# Patient Record
Sex: Female | Born: 1970 | Hispanic: No | Marital: Single | Smoking: Former smoker
Health system: Southern US, Community
[De-identification: ages and names within clinical notes are randomized; demographics above are authoritative.]

## PROBLEM LIST (undated history)

## (undated) DIAGNOSIS — F419 Anxiety disorder, unspecified: Secondary | ICD-10-CM

## (undated) DIAGNOSIS — F32A Depression, unspecified: Secondary | ICD-10-CM

## (undated) DIAGNOSIS — F4310 Post-traumatic stress disorder, unspecified: Secondary | ICD-10-CM

## (undated) DIAGNOSIS — F329 Major depressive disorder, single episode, unspecified: Secondary | ICD-10-CM

## (undated) HISTORY — DX: Post-traumatic stress disorder, unspecified: F43.10

## (undated) HISTORY — PX: CARPAL TUNNEL RELEASE: SHX101

## (undated) HISTORY — DX: Depression, unspecified: F32.A

## (undated) HISTORY — PX: TUBAL LIGATION: SHX77

## (undated) HISTORY — DX: Major depressive disorder, single episode, unspecified: F32.9

## (undated) HISTORY — DX: Anxiety disorder, unspecified: F41.9

---

## 1998-08-25 ENCOUNTER — Emergency Department (HOSPITAL_COMMUNITY): Admission: EM | Admit: 1998-08-25 | Discharge: 1998-08-25 | Payer: Self-pay | Admitting: Endocrinology

## 1998-09-24 ENCOUNTER — Encounter: Admission: RE | Admit: 1998-09-24 | Discharge: 1998-09-24 | Payer: Self-pay | Admitting: Sports Medicine

## 1999-02-18 ENCOUNTER — Encounter: Admission: RE | Admit: 1999-02-18 | Discharge: 1999-02-18 | Payer: Self-pay | Admitting: Family Medicine

## 1999-03-11 ENCOUNTER — Encounter: Admission: RE | Admit: 1999-03-11 | Discharge: 1999-03-11 | Payer: Self-pay | Admitting: Family Medicine

## 1999-03-11 ENCOUNTER — Other Ambulatory Visit: Admission: RE | Admit: 1999-03-11 | Discharge: 1999-03-11 | Payer: Self-pay | Admitting: *Deleted

## 1999-04-10 ENCOUNTER — Encounter: Admission: RE | Admit: 1999-04-10 | Discharge: 1999-04-10 | Payer: Self-pay | Admitting: Family Medicine

## 1999-05-10 ENCOUNTER — Ambulatory Visit (HOSPITAL_COMMUNITY): Admission: RE | Admit: 1999-05-10 | Discharge: 1999-05-10 | Payer: Self-pay | Admitting: *Deleted

## 1999-05-17 ENCOUNTER — Encounter: Admission: RE | Admit: 1999-05-17 | Discharge: 1999-05-17 | Payer: Self-pay | Admitting: Family Medicine

## 1999-05-27 ENCOUNTER — Encounter: Admission: RE | Admit: 1999-05-27 | Discharge: 1999-05-27 | Payer: Self-pay | Admitting: Family Medicine

## 1999-06-26 ENCOUNTER — Encounter: Admission: RE | Admit: 1999-06-26 | Discharge: 1999-06-26 | Payer: Self-pay | Admitting: Family Medicine

## 1999-06-28 ENCOUNTER — Encounter: Admission: RE | Admit: 1999-06-28 | Discharge: 1999-06-28 | Payer: Self-pay | Admitting: Sports Medicine

## 1999-07-11 ENCOUNTER — Ambulatory Visit (HOSPITAL_COMMUNITY): Admission: RE | Admit: 1999-07-11 | Discharge: 1999-07-11 | Payer: Self-pay | Admitting: *Deleted

## 1999-07-26 ENCOUNTER — Encounter: Admission: RE | Admit: 1999-07-26 | Discharge: 1999-07-26 | Payer: Self-pay | Admitting: Family Medicine

## 1999-08-09 ENCOUNTER — Encounter: Admission: RE | Admit: 1999-08-09 | Discharge: 1999-08-09 | Payer: Self-pay | Admitting: Family Medicine

## 1999-08-22 ENCOUNTER — Encounter: Admission: RE | Admit: 1999-08-22 | Discharge: 1999-08-22 | Payer: Self-pay | Admitting: Family Medicine

## 1999-09-05 ENCOUNTER — Encounter: Admission: RE | Admit: 1999-09-05 | Discharge: 1999-09-05 | Payer: Self-pay | Admitting: Family Medicine

## 1999-09-13 ENCOUNTER — Encounter: Admission: RE | Admit: 1999-09-13 | Discharge: 1999-09-13 | Payer: Self-pay | Admitting: Family Medicine

## 1999-09-18 ENCOUNTER — Encounter: Admission: RE | Admit: 1999-09-18 | Discharge: 1999-09-18 | Payer: Self-pay | Admitting: Family Medicine

## 1999-09-27 ENCOUNTER — Encounter: Admission: RE | Admit: 1999-09-27 | Discharge: 1999-09-27 | Payer: Self-pay | Admitting: Family Medicine

## 1999-10-04 ENCOUNTER — Encounter: Admission: RE | Admit: 1999-10-04 | Discharge: 1999-10-04 | Payer: Self-pay | Admitting: Family Medicine

## 1999-10-05 ENCOUNTER — Inpatient Hospital Stay (HOSPITAL_COMMUNITY): Admission: AD | Admit: 1999-10-05 | Discharge: 1999-10-07 | Payer: Self-pay | Admitting: Obstetrics

## 1999-12-27 ENCOUNTER — Encounter: Admission: RE | Admit: 1999-12-27 | Discharge: 1999-12-27 | Payer: Self-pay | Admitting: Family Medicine

## 2000-02-02 ENCOUNTER — Emergency Department (HOSPITAL_COMMUNITY): Admission: EM | Admit: 2000-02-02 | Discharge: 2000-02-02 | Payer: Self-pay | Admitting: Emergency Medicine

## 2000-02-02 ENCOUNTER — Encounter: Payer: Self-pay | Admitting: Emergency Medicine

## 2000-02-11 ENCOUNTER — Emergency Department (HOSPITAL_COMMUNITY): Admission: EM | Admit: 2000-02-11 | Discharge: 2000-02-11 | Payer: Self-pay | Admitting: Emergency Medicine

## 2000-02-11 ENCOUNTER — Encounter: Payer: Self-pay | Admitting: Emergency Medicine

## 2000-02-26 ENCOUNTER — Encounter: Admission: RE | Admit: 2000-02-26 | Discharge: 2000-05-26 | Payer: Self-pay | Admitting: Orthopedic Surgery

## 2001-02-06 ENCOUNTER — Emergency Department (HOSPITAL_COMMUNITY): Admission: EM | Admit: 2001-02-06 | Discharge: 2001-02-06 | Payer: Self-pay | Admitting: Emergency Medicine

## 2001-03-11 ENCOUNTER — Encounter: Admission: RE | Admit: 2001-03-11 | Discharge: 2001-03-11 | Payer: Self-pay | Admitting: Family Medicine

## 2001-03-24 ENCOUNTER — Encounter: Admission: RE | Admit: 2001-03-24 | Discharge: 2001-03-24 | Payer: Self-pay | Admitting: Family Medicine

## 2001-05-19 ENCOUNTER — Encounter: Payer: Self-pay | Admitting: Emergency Medicine

## 2001-05-19 ENCOUNTER — Emergency Department (HOSPITAL_COMMUNITY): Admission: EM | Admit: 2001-05-19 | Discharge: 2001-05-19 | Payer: Self-pay | Admitting: Emergency Medicine

## 2002-02-08 ENCOUNTER — Emergency Department (HOSPITAL_COMMUNITY): Admission: EM | Admit: 2002-02-08 | Discharge: 2002-02-08 | Payer: Self-pay | Admitting: Family Medicine

## 2003-07-18 ENCOUNTER — Emergency Department (HOSPITAL_COMMUNITY): Admission: EM | Admit: 2003-07-18 | Discharge: 2003-07-18 | Payer: Self-pay | Admitting: Emergency Medicine

## 2003-08-28 ENCOUNTER — Inpatient Hospital Stay (HOSPITAL_COMMUNITY): Admission: EM | Admit: 2003-08-28 | Discharge: 2003-08-31 | Payer: Self-pay | Admitting: Psychiatry

## 2003-11-18 ENCOUNTER — Emergency Department (HOSPITAL_COMMUNITY): Admission: EM | Admit: 2003-11-18 | Discharge: 2003-11-18 | Payer: Self-pay | Admitting: Emergency Medicine

## 2004-01-05 ENCOUNTER — Other Ambulatory Visit: Admission: RE | Admit: 2004-01-05 | Discharge: 2004-01-05 | Payer: Self-pay | Admitting: Family Medicine

## 2004-01-05 ENCOUNTER — Encounter: Admission: RE | Admit: 2004-01-05 | Discharge: 2004-01-05 | Payer: Self-pay | Admitting: Family Medicine

## 2004-01-12 ENCOUNTER — Encounter: Admission: RE | Admit: 2004-01-12 | Discharge: 2004-01-12 | Payer: Self-pay | Admitting: Sports Medicine

## 2004-01-14 ENCOUNTER — Encounter (INDEPENDENT_AMBULATORY_CARE_PROVIDER_SITE_OTHER): Payer: Self-pay | Admitting: *Deleted

## 2004-01-26 ENCOUNTER — Encounter: Admission: RE | Admit: 2004-01-26 | Discharge: 2004-01-26 | Payer: Self-pay | Admitting: Family Medicine

## 2004-12-15 ENCOUNTER — Emergency Department (HOSPITAL_COMMUNITY): Admission: EM | Admit: 2004-12-15 | Discharge: 2004-12-15 | Payer: Self-pay | Admitting: Emergency Medicine

## 2006-10-08 DIAGNOSIS — E781 Pure hyperglyceridemia: Secondary | ICD-10-CM

## 2006-10-08 DIAGNOSIS — G4700 Insomnia, unspecified: Secondary | ICD-10-CM

## 2006-10-08 DIAGNOSIS — F4323 Adjustment disorder with mixed anxiety and depressed mood: Secondary | ICD-10-CM | POA: Insufficient documentation

## 2006-10-09 ENCOUNTER — Encounter (INDEPENDENT_AMBULATORY_CARE_PROVIDER_SITE_OTHER): Payer: Self-pay | Admitting: *Deleted

## 2006-10-25 ENCOUNTER — Inpatient Hospital Stay (HOSPITAL_COMMUNITY): Admission: AD | Admit: 2006-10-25 | Discharge: 2006-10-25 | Payer: Self-pay | Admitting: Gynecology

## 2007-01-13 ENCOUNTER — Inpatient Hospital Stay (HOSPITAL_COMMUNITY): Admission: AD | Admit: 2007-01-13 | Discharge: 2007-01-13 | Payer: Self-pay | Admitting: Obstetrics and Gynecology

## 2007-02-28 ENCOUNTER — Inpatient Hospital Stay (HOSPITAL_COMMUNITY): Admission: AD | Admit: 2007-02-28 | Discharge: 2007-03-01 | Payer: Self-pay | Admitting: Obstetrics and Gynecology

## 2007-03-02 ENCOUNTER — Inpatient Hospital Stay (HOSPITAL_COMMUNITY): Admission: AD | Admit: 2007-03-02 | Discharge: 2007-03-02 | Payer: Self-pay | Admitting: Obstetrics and Gynecology

## 2007-03-08 ENCOUNTER — Inpatient Hospital Stay (HOSPITAL_COMMUNITY): Admission: AD | Admit: 2007-03-08 | Discharge: 2007-03-08 | Payer: Self-pay | Admitting: Obstetrics and Gynecology

## 2007-03-09 ENCOUNTER — Inpatient Hospital Stay (HOSPITAL_COMMUNITY): Admission: AD | Admit: 2007-03-09 | Discharge: 2007-03-12 | Payer: Self-pay | Admitting: Obstetrics and Gynecology

## 2007-03-19 ENCOUNTER — Emergency Department (HOSPITAL_COMMUNITY): Admission: EM | Admit: 2007-03-19 | Discharge: 2007-03-19 | Payer: Self-pay | Admitting: Emergency Medicine

## 2007-04-02 ENCOUNTER — Emergency Department (HOSPITAL_COMMUNITY): Admission: EM | Admit: 2007-04-02 | Discharge: 2007-04-02 | Payer: Self-pay | Admitting: Emergency Medicine

## 2007-04-19 ENCOUNTER — Encounter: Admission: RE | Admit: 2007-04-19 | Discharge: 2007-04-19 | Payer: Self-pay | Admitting: Cardiovascular Disease

## 2007-05-18 ENCOUNTER — Encounter: Admission: RE | Admit: 2007-05-18 | Discharge: 2007-05-18 | Payer: Self-pay | Admitting: Cardiovascular Disease

## 2007-06-05 ENCOUNTER — Emergency Department (HOSPITAL_COMMUNITY): Admission: EM | Admit: 2007-06-05 | Discharge: 2007-06-05 | Payer: Self-pay | Admitting: Emergency Medicine

## 2007-07-11 ENCOUNTER — Emergency Department (HOSPITAL_COMMUNITY): Admission: EM | Admit: 2007-07-11 | Discharge: 2007-07-12 | Payer: Self-pay | Admitting: Emergency Medicine

## 2007-08-12 HISTORY — PX: BACK SURGERY: SHX140

## 2007-09-09 ENCOUNTER — Inpatient Hospital Stay (HOSPITAL_COMMUNITY): Admission: EM | Admit: 2007-09-09 | Discharge: 2007-09-16 | Payer: Self-pay | Admitting: Emergency Medicine

## 2007-09-28 ENCOUNTER — Emergency Department (HOSPITAL_COMMUNITY): Admission: EM | Admit: 2007-09-28 | Discharge: 2007-09-29 | Payer: Self-pay | Admitting: Emergency Medicine

## 2007-10-08 ENCOUNTER — Ambulatory Visit: Payer: Self-pay | Admitting: Surgery

## 2007-10-08 ENCOUNTER — Ambulatory Visit (HOSPITAL_COMMUNITY): Admission: RE | Admit: 2007-10-08 | Discharge: 2007-10-08 | Payer: Self-pay | Admitting: Neurological Surgery

## 2007-10-08 ENCOUNTER — Encounter (INDEPENDENT_AMBULATORY_CARE_PROVIDER_SITE_OTHER): Payer: Self-pay | Admitting: Neurological Surgery

## 2007-10-12 ENCOUNTER — Telehealth: Payer: Self-pay | Admitting: *Deleted

## 2007-12-24 ENCOUNTER — Emergency Department (HOSPITAL_COMMUNITY): Admission: EM | Admit: 2007-12-24 | Discharge: 2007-12-24 | Payer: Self-pay | Admitting: Emergency Medicine

## 2008-02-06 ENCOUNTER — Emergency Department (HOSPITAL_COMMUNITY): Admission: EM | Admit: 2008-02-06 | Discharge: 2008-02-06 | Payer: Self-pay | Admitting: Emergency Medicine

## 2008-03-10 ENCOUNTER — Emergency Department (HOSPITAL_COMMUNITY): Admission: EM | Admit: 2008-03-10 | Discharge: 2008-03-10 | Payer: Self-pay | Admitting: Emergency Medicine

## 2008-03-13 ENCOUNTER — Emergency Department (HOSPITAL_COMMUNITY): Admission: EM | Admit: 2008-03-13 | Discharge: 2008-03-13 | Payer: Self-pay | Admitting: Emergency Medicine
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
## 2008-03-29 ENCOUNTER — Emergency Department (HOSPITAL_COMMUNITY): Admission: EM | Admit: 2008-03-29 | Discharge: 2008-03-29 | Payer: Self-pay | Admitting: Emergency Medicine

## 2008-06-03 ENCOUNTER — Encounter: Admission: RE | Admit: 2008-06-03 | Discharge: 2008-06-03 | Payer: Self-pay | Admitting: Cardiovascular Disease

## 2008-09-27 ENCOUNTER — Emergency Department: Admission: EM | Admit: 2008-09-27 | Discharge: 2008-09-27 | Payer: Self-pay | Admitting: Emergency Medicine

## 2009-02-03 ENCOUNTER — Emergency Department (HOSPITAL_COMMUNITY): Admission: EM | Admit: 2009-02-03 | Discharge: 2009-02-03 | Payer: Self-pay | Admitting: Emergency Medicine

## 2009-05-07 ENCOUNTER — Ambulatory Visit (HOSPITAL_COMMUNITY): Admission: RE | Admit: 2009-05-07 | Discharge: 2009-05-07 | Payer: Self-pay | Admitting: Obstetrics & Gynecology

## 2009-06-19 ENCOUNTER — Ambulatory Visit: Payer: Self-pay | Admitting: Vascular Surgery

## 2009-06-19 ENCOUNTER — Encounter (INDEPENDENT_AMBULATORY_CARE_PROVIDER_SITE_OTHER): Payer: Self-pay | Admitting: Cardiovascular Disease

## 2009-06-19 ENCOUNTER — Ambulatory Visit (HOSPITAL_COMMUNITY): Admission: RE | Admit: 2009-06-19 | Discharge: 2009-06-19 | Payer: Self-pay | Admitting: Cardiovascular Disease

## 2009-10-18 ENCOUNTER — Emergency Department (HOSPITAL_COMMUNITY): Admission: EM | Admit: 2009-10-18 | Discharge: 2009-10-18 | Payer: Self-pay | Admitting: Emergency Medicine

## 2009-10-22 ENCOUNTER — Ambulatory Visit (HOSPITAL_BASED_OUTPATIENT_CLINIC_OR_DEPARTMENT_OTHER): Admission: RE | Admit: 2009-10-22 | Discharge: 2009-10-22 | Payer: Self-pay | Admitting: Otolaryngology

## 2010-04-03 ENCOUNTER — Emergency Department (HOSPITAL_COMMUNITY): Admission: EM | Admit: 2010-04-03 | Discharge: 2010-04-03 | Payer: Self-pay | Admitting: Emergency Medicine

## 2010-05-09 ENCOUNTER — Emergency Department (HOSPITAL_COMMUNITY): Admission: EM | Admit: 2010-05-09 | Discharge: 2010-05-09 | Payer: Self-pay | Admitting: Emergency Medicine

## 2010-11-15 LAB — CBC
HCT: 34.2 % — ABNORMAL LOW (ref 36.0–46.0)
Hemoglobin: 11.6 g/dL — ABNORMAL LOW (ref 12.0–15.0)
MCV: 83.8 fL (ref 78.0–100.0)
RBC: 4.08 MIL/uL (ref 3.87–5.11)
WBC: 10.3 10*3/uL (ref 4.0–10.5)
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
## 2010-12-24 NOTE — Discharge Summary (Signed)
Christy Walker, Christy Walker NO.:  [id number]   MEDICAL RECORD NO.:  [id number]          PATIENT TYPE:  INP   LOCATION:  9126                          FACILITY:  [facility]   PHYSICIAN:  Malva Limes, M.D.    DATE OF BIRTH:  [birth year]   DATE OF ADMISSION:  03/09/2007  DATE OF DISCHARGE:  03/12/2007                               DISCHARGE SUMMARY   FINAL DIAGNOSES:  1. Intrauterine pregnancy at 53 weeks' gestation.  2. Positive group B Strep.  3. Spontaneous rupture of membranes.  4. Vacuum extractor assisted delivery of a female infant with Apgars      of 7 and 9.   PROCEDURE:  Vacuum extractor assisted delivery performed by Dr. Carrington Clamp.   COMPLICATIONS:  None.   HISTORY OF PRESENT ILLNESS:  This 40 year old G4, P3, 0-0-3 presents at  88 weeks' gestation with rupture of membranes in early labor.  The  patient's antepartum course up to this point had been complicated by  advanced maternal age.  The patient did have a quad screen that came  back within normal limits and declined any other further workup.  The  patient had late prenatal care.  She was not seen in our office until 21  weeks.  The patient also has a history of depression.  She was on  Effexor throughout her pregnancy and was stable.  The patient is also a  smoker and was counseled on cessation multiple times.  Otherwise, the  patient's antepartum course was benign.  She did have a positive group B  strep culture obtained in our office with the intrauterine pregnancy.   HOSPITAL COURSE:  The patient had a normal labor curve.  She dilated to  complete.  She pushed through about three contractions.  At this point,  fetal heart tones back to the 70s and the 50s.  At this point, a vacuum  extractor was applied, and delivery occurred after the second pull.  There was a nuchal cord x1 that was reduced.  There was also a shoulder  dystocia that was relieved with McRoberts in less than 15 seconds.  Peds  was in attendance for the delivery.  The patient had a delivery of a 7  pound 5 ounce female infant with Apgars of 7 and 9.  There was a second-  degree midline episiotomy that was cut prior to the vacuum being  applied.  Delivery went without complications.  The patient's  postoperative course was benign without any significant fevers.  The  baby was in the NICU.  I do not see any other reasons why.  The patient  was felt ready for discharge on postpartum day #1.  She was sent home on  a regular diet, told to decrease activities, told to continue her  vitamins.  She was given Percocet one to two q.4-6h. as needed for the  pain and was to follow up in our office in 6 weeks.   LABS ON DISCHARGE:  The patient had hemoglobin of 9.9 which was down  from a preoperative level of 10.9, a white blood cell  count of 11.4 and  platelets of 192,000.  The patient also did receive penicillin  prophylaxis for her positive group B strep status.      Leilani Able, P.A.-C.    ______________________________  Malva Limes, M.D.    MB/MEDQ  D:  04/12/2007  T:  04/12/2007  Job:  [id number]
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
## 2010-12-27 NOTE — Discharge Summary (Signed)
[facility]  Patient:    Christy Walker, Christy Walker                      MRN: [id number] Adm. Date:  [id number] Disc. Date: 10/07/99 Attending:  Tammi Sou Dictator:   Lavonia Dana, M.D. CC:         Lavonia Dana, M.D.                           Discharge Summary  DISCHARGE DIAGNOSES: 1.  Normal spontaneous vaginal delivery at 40-1/7th weeks.  HISTORY OF PRESENT ILLNESS:  The patient is a 40 year old G3, P2, 0-0-2, at 40-1/7th weeks, admitted for onset of uterine contractions with increasing intensity.  On admission on cervical examination the cervix was found to be 6 to 7 cms and 70 per cent effacement, and -1 position.  There were no prenatal complications other than maternal smoking.  HOSPITAL COURSE:  The patient was admitted and expectantly managed.  She then wanted an epidural, which was done.  The patient then progressed to complete dilation at 0358 on October 05, 1999 and delivered a 3500 gram (7 pounds 11 ounces) viable female at 73 on October 05, 1999.  The only intrapartum complication was meconium stained fluid, for which the NICU resuscitation team as present.  The Apgar scores were 8 at one minute and 9 at five minutes and the baby had a vigorous cry upon delivery.  A primary laceration was repaired and hemostasis obtained and the mother was stable.  Her postpartum course was unremarkable. Her hemoglobin dropped to 10.3 on postpartum day one.  She was asymptomatic and her  bleeding was slowing.  She was ready for discharge on October 07, 1999.  She is interested in Norplant, which will be set up as an outpatient at the [facility] for birth control.  She is bottle feeding her baby.  DISPOSITION:  She will be contacted to follow up at the [facility] or Norplant.  Otherwise, she will schedule a six week postpartum check.  She will bring the baby to the [facility] in one week from  now for a weight check. DD:  10/07/99 TD:  10/07/99 Job: [id number]
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
## 2011-05-01 LAB — CBC
HCT: 32.3 — ABNORMAL LOW
HCT: 38.5
HCT: 38.7
Hemoglobin: 12.9
MCHC: 33.3
MCHC: 33.4
MCV: 80.7
MCV: 80.7
Platelets: 242
RBC: 4.01
RBC: 4.8
RDW: 14.4
WBC: 16 — ABNORMAL HIGH
WBC: 17.4 — ABNORMAL HIGH

## 2011-05-01 LAB — BASIC METABOLIC PANEL
BUN: 3 — ABNORMAL LOW
BUN: 5 — ABNORMAL LOW
CO2: 26
Calcium: 9.3
Chloride: 103
Creatinine, Ser: 0.53
GFR calc non Af Amer: >60
Glucose, Bld: 123 — ABNORMAL HIGH
Potassium: 3.6

## 2011-05-01 LAB — COMPREHENSIVE METABOLIC PANEL
CO2: 26
Calcium: 8.7
Chloride: 104
Creatinine, Ser: 0.5
GFR calc non Af Amer: >60
Glucose, Bld: 130 — ABNORMAL HIGH
Total Bilirubin: 0.5

## 2011-05-01 LAB — URINALYSIS, ROUTINE W REFLEX MICROSCOPIC
Ketones, ur: NEGATIVE
Nitrite: NEGATIVE
Specific Gravity, Urine: 1.03
Urobilinogen, UA: 0.2
pH: 7

## 2011-05-01 LAB — TYPE AND SCREEN
ABO/RH(D): A POS
Antibody Screen: NEGATIVE

## 2011-05-01 LAB — POCT I-STAT 7, (LYTES, BLD GAS, ICA,H+H)
Acid-Base Excess: 2
Bicarbonate: 26.2 — ABNORMAL HIGH
HCT: 31 — ABNORMAL LOW
Operator id: 238831
Potassium: 4
Sodium: 137
TCO2: 27

## 2011-05-01 LAB — DIFFERENTIAL
Basophils Absolute: 0
Eosinophils Absolute: 0
Lymphocytes Relative: 7 — ABNORMAL LOW
Lymphs Abs: 2.1
Monocytes Relative: 5
WBC Morphology: INCREASED

## 2011-05-01 LAB — PROTIME-INR: Prothrombin Time: 13.1

## 2011-05-02 LAB — BASIC METABOLIC PANEL
BUN: 5 — ABNORMAL LOW
CO2: 32
Calcium: 8 — ABNORMAL LOW
Creatinine, Ser: 0.59
Glucose, Bld: 136 — ABNORMAL HIGH

## 2011-05-02 LAB — URINE CULTURE: Colony Count: >=100000

## 2011-05-02 LAB — URINE MICROSCOPIC-ADD ON

## 2011-05-02 LAB — URINALYSIS, ROUTINE W REFLEX MICROSCOPIC
Glucose, UA: NEGATIVE
Ketones, ur: NEGATIVE
Nitrite: NEGATIVE
Protein, ur: NEGATIVE
pH: 8

## 2011-05-02 LAB — CBC
MCHC: 33.3
MCHC: 33.9
Platelets: 158
RBC: 3.3 — ABNORMAL LOW
RDW: 13.7
WBC: 14.1 — ABNORMAL HIGH

## 2011-05-08 LAB — URINALYSIS, ROUTINE W REFLEX MICROSCOPIC
Bilirubin Urine: NEGATIVE
Glucose, UA: NEGATIVE
Specific Gravity, Urine: 1.01
Urobilinogen, UA: 0.2

## 2011-05-08 LAB — URINE MICROSCOPIC-ADD ON

## 2011-05-10 ENCOUNTER — Emergency Department (HOSPITAL_COMMUNITY)
Admission: EM | Admit: 2011-05-10 | Discharge: 2011-05-10 | Disposition: A | Discharge status: Home / Self Care | Payer: Medicare Other | Attending: Emergency Medicine | Admitting: Emergency Medicine

## 2011-05-10 DIAGNOSIS — L0231 Cutaneous abscess of buttock: Secondary | ICD-10-CM | POA: Insufficient documentation

## 2011-05-10 DIAGNOSIS — Z86718 Personal history of other venous thrombosis and embolism: Secondary | ICD-10-CM | POA: Insufficient documentation

## 2011-05-19 LAB — ETHANOL: Alcohol, Ethyl (B): 161 — ABNORMAL HIGH

## 2011-05-26 LAB — CBC
HCT: 32.1 — ABNORMAL LOW
Hemoglobin: 10.9 — ABNORMAL LOW
MCHC: 33.5
MCV: 80
MCV: 81.1
RBC: 3.65 — ABNORMAL LOW
RDW: 14.9 — ABNORMAL HIGH
RDW: 15.2 — ABNORMAL HIGH

## 2011-05-26 LAB — URINALYSIS, ROUTINE W REFLEX MICROSCOPIC
Bilirubin Urine: NEGATIVE
Glucose, UA: NEGATIVE
Hgb urine dipstick: NEGATIVE
Ketones, ur: NEGATIVE
Ketones, ur: NEGATIVE
Leukocytes, UA: NEGATIVE
Nitrite: NEGATIVE
Protein, ur: NEGATIVE
Protein, ur: NEGATIVE
Urobilinogen, UA: 0.2
pH: 5.5

## 2011-05-26 LAB — URINE MICROSCOPIC-ADD ON

## 2011-05-29 LAB — URINALYSIS, ROUTINE W REFLEX MICROSCOPIC
Glucose, UA: NEGATIVE
Ketones, ur: NEGATIVE
Protein, ur: NEGATIVE
Urobilinogen, UA: 0.2

## 2011-07-23 ENCOUNTER — Other Ambulatory Visit: Payer: Self-pay | Admitting: Internal Medicine

## 2011-10-10 ENCOUNTER — Emergency Department (HOSPITAL_COMMUNITY)
Admission: EM | Admit: 2011-10-10 | Discharge: 2011-10-10 | Disposition: A | Discharge status: Home / Self Care | Payer: Medicare Other

## 2011-12-12 ENCOUNTER — Emergency Department: Payer: Self-pay | Admitting: Emergency Medicine

## 2011-12-12 LAB — COMPREHENSIVE METABOLIC PANEL
Anion Gap: 9 (ref 7–16)
Bilirubin,Total: 0.3 mg/dL (ref 0.2–1.0)
Creatinine: 0.6 mg/dL (ref 0.60–1.30)
EGFR (Non-African Amer.): >60
Glucose: 91 mg/dL (ref 65–99)
Osmolality: 279 (ref 275–301)
Potassium: 3.5 mmol/L (ref 3.5–5.1)
SGOT(AST): 25 U/L (ref 15–37)
SGPT (ALT): 19 U/L
Sodium: 141 mmol/L (ref 136–145)
Total Protein: 7.3 g/dL (ref 6.4–8.2)

## 2011-12-12 LAB — URINALYSIS, COMPLETE
Bacteria: NONE SEEN
Glucose,UR: NEGATIVE mg/dL (ref 0–75)
Leukocyte Esterase: NEGATIVE
Ph: 6 (ref 4.5–8.0)
RBC,UR: 3 /HPF (ref 0–5)
Specific Gravity: 1.016 (ref 1.003–1.030)
Squamous Epithelial: 1

## 2011-12-12 LAB — CBC
HCT: 34.6 % — ABNORMAL LOW (ref 35.0–47.0)
Platelet: 255 10*3/uL (ref 150–440)
RDW: 14.9 % — ABNORMAL HIGH (ref 11.5–14.5)
WBC: 12.3 10*3/uL — ABNORMAL HIGH (ref 3.6–11.0)

## 2011-12-12 LAB — HCG, QUANTITATIVE, PREGNANCY: Beta Hcg, Quant.: 1 m[IU]/mL

## 2012-10-12 ENCOUNTER — Emergency Department (HOSPITAL_COMMUNITY)
Admission: EM | Admit: 2012-10-12 | Discharge: 2012-10-12 | Disposition: A | Discharge status: Home / Self Care | Payer: Medicare Other | Attending: Emergency Medicine | Admitting: Emergency Medicine

## 2012-10-12 ENCOUNTER — Encounter (HOSPITAL_COMMUNITY): Payer: Self-pay | Admitting: *Deleted

## 2012-10-12 DIAGNOSIS — F17200 Nicotine dependence, unspecified, uncomplicated: Secondary | ICD-10-CM | POA: Insufficient documentation

## 2012-10-12 DIAGNOSIS — Y9389 Activity, other specified: Secondary | ICD-10-CM | POA: Insufficient documentation

## 2012-10-12 DIAGNOSIS — W268XXA Contact with other sharp object(s), not elsewhere classified, initial encounter: Secondary | ICD-10-CM | POA: Insufficient documentation

## 2012-10-12 DIAGNOSIS — Y9289 Other specified places as the place of occurrence of the external cause: Secondary | ICD-10-CM | POA: Insufficient documentation

## 2012-10-12 DIAGNOSIS — S61409A Unspecified open wound of unspecified hand, initial encounter: Secondary | ICD-10-CM | POA: Insufficient documentation

## 2012-10-12 MED ORDER — TETANUS-DIPHTH-ACELL PERTUSSIS 5-2.5-18.5 LF-MCG/0.5 IM SUSP
0.5000 mL | Freq: Once | INTRAMUSCULAR | Status: AC
  Administered 2012-10-12: 0.5 mL via INTRAMUSCULAR
  Filled 2012-10-12: qty 0.5
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
## 2012-10-12 NOTE — ED Notes (Signed)
Pt with approx 1 x 1 inch L shaped lac over 3rd joint of R pointer finger.  Pt states she was washing a mason jar last night and it broke.

## 2012-10-12 NOTE — ED Notes (Signed)
PA at bedside.

## 2012-10-13 NOTE — ED Provider Notes (Signed)
Medical screening examination/treatment/procedure(s) were performed by non-physician practitioner and as supervising physician I was immediately available for consultation/collaboration.   [name], MD 10/13/12 [phone]

## 2012-12-07 ENCOUNTER — Emergency Department (HOSPITAL_COMMUNITY)
Admission: EM | Admit: 2012-12-07 | Discharge: 2012-12-07 | Disposition: A | Discharge status: Home / Self Care | Payer: Medicare Other | Attending: Emergency Medicine | Admitting: Emergency Medicine

## 2012-12-07 ENCOUNTER — Encounter (HOSPITAL_COMMUNITY): Payer: Self-pay | Admitting: Emergency Medicine

## 2012-12-07 DIAGNOSIS — K0889 Other specified disorders of teeth and supporting structures: Secondary | ICD-10-CM

## 2012-12-07 DIAGNOSIS — K089 Disorder of teeth and supporting structures, unspecified: Secondary | ICD-10-CM | POA: Insufficient documentation

## 2012-12-07 DIAGNOSIS — F17200 Nicotine dependence, unspecified, uncomplicated: Secondary | ICD-10-CM | POA: Insufficient documentation

## 2012-12-07 MED ORDER — HYDROCODONE-ACETAMINOPHEN 5-325 MG PO TABS: ORAL_TABLET | ORAL | Status: DC

## 2012-12-07 MED ORDER — PENICILLIN V POTASSIUM 500 MG PO TABS: 500.0000 mg | ORAL_TABLET | Freq: Four times a day (QID) | ORAL | Status: DC

## 2012-12-07 NOTE — ED Notes (Signed)
Pt reports her daughter was jumping up and down and accidentally hit her in the chin with her head.  Pt reports chipping her top front tooth and reports pain.  Pain is worse when "air hits it."  No obvious deformity to mouth noted.  No loose tooth noted as well.
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
## 2012-12-07 NOTE — ED Provider Notes (Signed)
History/physical exam/procedure(s) were performed by non-physician practitioner and as supervising physician I was immediately available for consultation/collaboration. I have reviewed all notes and am in agreement with care and plan.   Hilario Quarry, MD 12/07/12 [phone]
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
## 2013-01-19 ENCOUNTER — Encounter (HOSPITAL_COMMUNITY): Payer: Self-pay | Admitting: Emergency Medicine

## 2013-01-19 ENCOUNTER — Emergency Department (HOSPITAL_COMMUNITY)
Admission: EM | Admit: 2013-01-19 | Discharge: 2013-01-19 | Disposition: A | Discharge status: Home / Self Care | Payer: Medicare Other | Attending: Emergency Medicine | Admitting: Emergency Medicine

## 2013-01-19 DIAGNOSIS — F17200 Nicotine dependence, unspecified, uncomplicated: Secondary | ICD-10-CM | POA: Insufficient documentation

## 2013-01-19 DIAGNOSIS — Y929 Unspecified place or not applicable: Secondary | ICD-10-CM | POA: Insufficient documentation

## 2013-01-19 DIAGNOSIS — X500XXA Overexertion from strenuous movement or load, initial encounter: Secondary | ICD-10-CM | POA: Insufficient documentation

## 2013-01-19 DIAGNOSIS — S335XXA Sprain of ligaments of lumbar spine, initial encounter: Secondary | ICD-10-CM | POA: Insufficient documentation

## 2013-01-19 DIAGNOSIS — S39012A Strain of muscle, fascia and tendon of lower back, initial encounter: Secondary | ICD-10-CM

## 2013-01-19 DIAGNOSIS — Y9389 Activity, other specified: Secondary | ICD-10-CM | POA: Insufficient documentation

## 2013-01-19 MED ORDER — CYCLOBENZAPRINE HCL 10 MG PO TABS: 10.0000 mg | ORAL_TABLET | Freq: Two times a day (BID) | ORAL | Status: DC | PRN

## 2013-01-19 MED ORDER — HYDROCODONE-ACETAMINOPHEN 5-325 MG PO TABS: 1.0000 | ORAL_TABLET | Freq: Four times a day (QID) | ORAL | Status: DC | PRN

## 2013-01-19 MED ORDER — OXYCODONE-ACETAMINOPHEN 5-325 MG PO TABS
1.0000 | ORAL_TABLET | Freq: Once | ORAL | Status: AC
  Administered 2013-01-19: 1 via ORAL
  Filled 2013-01-19: qty 1

## 2013-01-19 MED ORDER — NAPROXEN 500 MG PO TABS: 500.0000 mg | ORAL_TABLET | Freq: Two times a day (BID) | ORAL | Status: DC

## 2013-01-19 MED ORDER — NAPROXEN 250 MG PO TABS
500.0000 mg | ORAL_TABLET | Freq: Once | ORAL | Status: AC
  Administered 2013-01-19: 500 mg via ORAL
  Filled 2013-01-19: qty 2
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
## 2013-01-19 NOTE — ED Notes (Signed)
Pt reports back pain radiating down left lower extremity, since yesterday lifting a heavy mop bucket. Pt ambulatory with steady gait, moving all extremities. Denies bowel, bladder incontinence, NAD at present.

## 2013-01-20 NOTE — ED Provider Notes (Signed)
Medical screening examination/treatment/procedure(s) were performed by non-physician practitioner and as supervising physician I was immediately available for consultation/collaboration.   Jayana Kotula J. Prince Olivier, MD 01/20/13 0708 

## 2013-03-06 ENCOUNTER — Emergency Department (HOSPITAL_COMMUNITY)
Admission: EM | Admit: 2013-03-06 | Discharge: 2013-03-06 | Disposition: A | Discharge status: Home / Self Care | Payer: Medicare Other | Attending: Emergency Medicine | Admitting: Emergency Medicine

## 2013-03-06 ENCOUNTER — Encounter (HOSPITAL_COMMUNITY): Payer: Self-pay | Admitting: Emergency Medicine

## 2013-03-06 DIAGNOSIS — S6990XA Unspecified injury of unspecified wrist, hand and finger(s), initial encounter: Secondary | ICD-10-CM | POA: Insufficient documentation

## 2013-03-06 DIAGNOSIS — F17200 Nicotine dependence, unspecified, uncomplicated: Secondary | ICD-10-CM | POA: Insufficient documentation

## 2013-03-06 DIAGNOSIS — Z4802 Encounter for removal of sutures: Secondary | ICD-10-CM | POA: Insufficient documentation

## 2013-03-06 DIAGNOSIS — Y9389 Activity, other specified: Secondary | ICD-10-CM | POA: Insufficient documentation

## 2013-03-06 DIAGNOSIS — Y929 Unspecified place or not applicable: Secondary | ICD-10-CM | POA: Insufficient documentation

## 2013-03-06 DIAGNOSIS — M79641 Pain in right hand: Secondary | ICD-10-CM

## 2013-03-06 DIAGNOSIS — Z9889 Other specified postprocedural states: Secondary | ICD-10-CM | POA: Insufficient documentation

## 2013-03-06 DIAGNOSIS — W208XXA Other cause of strike by thrown, projected or falling object, initial encounter: Secondary | ICD-10-CM | POA: Insufficient documentation

## 2013-03-06 DIAGNOSIS — Z79899 Other long term (current) drug therapy: Secondary | ICD-10-CM | POA: Insufficient documentation

## 2013-03-06 MED ORDER — ONDANSETRON 8 MG PO TBDP
8.0000 mg | ORAL_TABLET | Freq: Once | ORAL | Status: AC
  Administered 2013-03-06: 8 mg via ORAL
  Filled 2013-03-06: qty 1

## 2013-03-06 MED ORDER — HYDROCODONE-ACETAMINOPHEN 5-325 MG PO TABS: 2.0000 | ORAL_TABLET | Freq: Four times a day (QID) | ORAL | Status: DC | PRN

## 2013-03-06 MED ORDER — OXYCODONE-ACETAMINOPHEN 5-325 MG PO TABS
2.0000 | ORAL_TABLET | Freq: Once | ORAL | Status: AC
  Administered 2013-03-06: 2 via ORAL
  Filled 2013-03-06: qty 2

## 2013-03-06 MED ORDER — PROMETHAZINE HCL 25 MG PO TABS: 25.0000 mg | ORAL_TABLET | Freq: Four times a day (QID) | ORAL | Status: DC | PRN
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
## 2013-03-06 NOTE — ED Notes (Signed)
Pt had carpal tunnel surgery on the 10th, stitches still in place, yesterday daughter was falling and she caught her, now with sharp pain in hand

## 2013-03-08 NOTE — ED Provider Notes (Signed)
Medical screening examination/treatment/procedure(s) were performed by non-physician practitioner and as supervising physician I was immediately available for consultation/collaboration.   Hurman Horn, MD 03/08/13 2106

## 2013-05-04 ENCOUNTER — Other Ambulatory Visit: Payer: Self-pay | Admitting: Cardiovascular Disease

## 2013-05-04 ENCOUNTER — Other Ambulatory Visit: Payer: Self-pay | Admitting: Orthopaedic Surgery

## 2013-05-04 DIAGNOSIS — M545 Low back pain, unspecified: Secondary | ICD-10-CM

## 2013-05-04 DIAGNOSIS — Z1231 Encounter for screening mammogram for malignant neoplasm of breast: Secondary | ICD-10-CM

## 2013-05-13 ENCOUNTER — Ambulatory Visit
Admission: RE | Admit: 2013-05-13 | Discharge: 2013-05-13 | Disposition: A | Discharge status: Home / Self Care | Payer: Medicare Other | Source: Ambulatory Visit | Attending: Orthopaedic Surgery | Admitting: Orthopaedic Surgery

## 2013-05-13 DIAGNOSIS — M545 Low back pain: Secondary | ICD-10-CM

## 2013-05-20 ENCOUNTER — Ambulatory Visit
Admission: RE | Admit: 2013-05-20 | Discharge: 2013-05-20 | Disposition: A | Discharge status: Home / Self Care | Payer: Medicare Other | Source: Ambulatory Visit | Attending: Cardiovascular Disease | Admitting: Cardiovascular Disease

## 2013-05-20 DIAGNOSIS — Z1231 Encounter for screening mammogram for malignant neoplasm of breast: Secondary | ICD-10-CM

## 2013-09-25 ENCOUNTER — Emergency Department (HOSPITAL_COMMUNITY)
Admission: EM | Admit: 2013-09-25 | Discharge: 2013-09-25 | Disposition: A | Discharge status: Home / Self Care | Payer: Medicare Other | Attending: Emergency Medicine | Admitting: Emergency Medicine

## 2013-09-25 ENCOUNTER — Emergency Department (HOSPITAL_COMMUNITY): Payer: Medicare Other

## 2013-09-25 ENCOUNTER — Encounter (HOSPITAL_COMMUNITY): Payer: Self-pay | Admitting: Emergency Medicine

## 2013-09-25 DIAGNOSIS — S139XXA Sprain of joints and ligaments of unspecified parts of neck, initial encounter: Secondary | ICD-10-CM | POA: Insufficient documentation

## 2013-09-25 DIAGNOSIS — Y9389 Activity, other specified: Secondary | ICD-10-CM | POA: Insufficient documentation

## 2013-09-25 DIAGNOSIS — S335XXA Sprain of ligaments of lumbar spine, initial encounter: Secondary | ICD-10-CM | POA: Insufficient documentation

## 2013-09-25 DIAGNOSIS — Z9889 Other specified postprocedural states: Secondary | ICD-10-CM | POA: Insufficient documentation

## 2013-09-25 DIAGNOSIS — S161XXA Strain of muscle, fascia and tendon at neck level, initial encounter: Secondary | ICD-10-CM

## 2013-09-25 DIAGNOSIS — S39012A Strain of muscle, fascia and tendon of lower back, initial encounter: Secondary | ICD-10-CM

## 2013-09-25 DIAGNOSIS — F17200 Nicotine dependence, unspecified, uncomplicated: Secondary | ICD-10-CM | POA: Insufficient documentation

## 2013-09-25 DIAGNOSIS — Z79899 Other long term (current) drug therapy: Secondary | ICD-10-CM | POA: Insufficient documentation

## 2013-09-25 DIAGNOSIS — Y92410 Unspecified street and highway as the place of occurrence of the external cause: Secondary | ICD-10-CM | POA: Insufficient documentation

## 2013-09-25 MED ORDER — HYDROCODONE-ACETAMINOPHEN 5-325 MG PO TABS: 1.0000 | ORAL_TABLET | Freq: Four times a day (QID) | ORAL | Status: DC | PRN

## 2013-09-25 MED ORDER — METHOCARBAMOL 500 MG PO TABS: 500.0000 mg | ORAL_TABLET | Freq: Two times a day (BID) | ORAL | Status: DC

## 2013-09-25 MED ORDER — HYDROCODONE-ACETAMINOPHEN 5-325 MG PO TABS
1.0000 | ORAL_TABLET | Freq: Once | ORAL | Status: AC
  Administered 2013-09-25: 1 via ORAL
  Filled 2013-09-25: qty 1

## 2013-09-25 NOTE — Discharge Instructions (Signed)
Please call your doctor for a followup appointment within 24-48 hours. When you talk to your doctor please let them know that you were seen in the emergency department and have them acquire all of your records so that they can discuss the findings with you and formulate a treatment plan to fully care for your new and ongoing problems. Please call and set up appointment with orthopedics and neurosurgery Please call and set up an appointment with your primary care provider to be reassessed Please take muscle relaxers-Robaxin as prescribed Please continue to take at home medications as prescribed Please avoid any physical or strenuous activity Please keep cervical collar on for comfort Please massage with icy hot ointment both lower back and neck Please continue to monitor symptoms closely and if symptoms are to worsen or change (fever greater then 101, chills, chest pain, shortness of breath, difficulty breathing, numbness, tingling, headache, blurred vision, sudden loss of vision, worsening headache, nausea, vomiting, inability to walk, numbness down the leg) please report back to emergency department immediately   Back Pain, Adult Low back pain is very common. About 1 in 5 people have back pain.The cause of low back pain is rarely dangerous. The pain often gets better over time.About half of people with a sudden onset of back pain feel better in just 2 weeks. About 8 in 10 people feel better by 6 weeks.  CAUSES Some common causes of back pain include:  Strain of the muscles or ligaments supporting the spine.  Wear and tear (degeneration) of the spinal discs.  Arthritis.  Direct injury to the back. DIAGNOSIS Most of the time, the direct cause of low back pain is not known.However, back pain can be treated effectively even when the exact cause of the pain is unknown.Answering your caregiver's questions about your overall health and symptoms is one of the most accurate ways to make sure the  cause of your pain is not dangerous. If your caregiver needs more information, he or she may order lab work or imaging tests (X-rays or MRIs).However, even if imaging tests show changes in your back, this usually does not require surgery. HOME CARE INSTRUCTIONS For many people, back pain returns.Since low back pain is rarely dangerous, it is often a condition that people can learn to [facility] their own.   Remain active. It is stressful on the back to sit or stand in one place. Do not sit, drive, or stand in one place for more than 30 minutes at a time. Take short walks on level surfaces as soon as pain allows.Try to increase the length of time you walk each day.  Do not stay in bed.Resting more than 1 or 2 days can delay your recovery.  Do not avoid exercise or work.Your body is made to move.It is not dangerous to be active, even though your back may hurt.Your back will likely heal faster if you return to being active before your pain is gone.  Pay attention to your body when you bend and lift. Many people have less discomfortwhen lifting if they bend their knees, keep the load close to their bodies,and avoid twisting. Often, the most comfortable positions are those that put less stress on your recovering back.  Find a comfortable position to sleep. Use a firm mattress and lie on your side with your knees slightly bent. If you lie on your back, put a pillow under your knees.  Only take over-the-counter or prescription medicines as directed by your caregiver. Over-the-counter medicines to  reduce pain and inflammation are often the most helpful.Your caregiver may prescribe muscle relaxant drugs.These medicines help dull your pain so you can more quickly return to your normal activities and healthy exercise.  Put ice on the injured area.  Put ice in a plastic bag.  Place a towel between your skin and the bag.  Leave the ice on for 15-20 minutes, 03-04 times a day for the first 2 to 3  days. After that, ice and heat may be alternated to reduce pain and spasms.  Ask your caregiver about trying back exercises and gentle massage. This may be of some benefit.  Avoid feeling anxious or stressed.Stress increases muscle tension and can worsen back pain.It is important to recognize when you are anxious or stressed and learn ways to manage it.Exercise is a great option. SEEK MEDICAL CARE IF:  You have pain that is not relieved with rest or medicine.  You have pain that does not improve in 1 week.  You have new symptoms.  You are generally not feeling well. SEEK IMMEDIATE MEDICAL CARE IF:   You have pain that radiates from your back into your legs.  You develop new bowel or bladder control problems.  You have unusual weakness or numbness in your arms or legs.  You develop nausea or vomiting.  You develop abdominal pain.  You feel faint. Document Released: 07/28/2005 Document Revised: 01/27/2012 Document Reviewed: 12/16/2010 [facility] Patient Information 2014 [place], [place].  Cervical Strain and Sprain (Whiplash) with Rehab Cervical strain and sprains are injuries that commonly occur with "whiplash" injuries. Whiplash occurs when the neck is forcefully whipped backward or forward, such as during a motor vehicle accident. The muscles, ligaments, tendons, discs and nerves of the neck are susceptible to injury when this occurs. SYMPTOMS   Pain or stiffness in the front and/or back of neck  Symptoms may present immediately or up to 24 hours after injury.  Dizziness, headache, nausea and vomiting.  Muscle spasm with soreness and stiffness in the neck.  Tenderness and swelling at the injury site. CAUSES  Whiplash injuries often occur during contact sports or motor vehicle accidents.  RISK INCREASES WITH:  Osteoarthritis of the spine.  Situations that make head or neck accidents or trauma more likely.  High-risk sports (football, rugby, wrestling, hockey, auto  racing, gymnastics, diving, contact karate or boxing).  Poor strength and flexibility of the neck.  Previous neck injury.  Poor tackling technique.  Improperly fitted or padded equipment. PREVENTION  Learn and use proper technique (avoid tackling with the head, spearing and head-butting; use proper falling techniques to avoid landing on the head).  Warm up and stretch properly before activity.  Maintain physical fitness:  Strength, flexibility and endurance.  Cardiovascular fitness.  Wear properly fitted and padded protective equipment, such as padded soft collars, for participation in contact sports. PROGNOSIS  Recovery for cervical strain and sprain injuries is dependent on the extent of the injury. These injuries are usually curable in 1 week to 3 months with appropriate treatment.  RELATED COMPLICATIONS   Temporary numbness and weakness may occur if the nerve roots are damaged, and this may persist until the nerve has completely healed.  Chronic pain due to frequent recurrence of symptoms.  Prolonged healing, especially if activity is resumed too soon (before complete recovery). TREATMENT  Treatment initially involves the use of ice and medication to help reduce pain and inflammation. It is also important to perform strengthening and stretching exercises and modify activities that worsen  symptoms so the injury does not get worse. These exercises may be performed at home or with a therapist. For patients who experience severe symptoms, a soft padded collar may be recommended to be worn around the neck.  Improving your posture may help reduce symptoms. Posture improvement includes pulling your chin and abdomen in while sitting or standing. If you are sitting, sit in a firm chair with your buttocks against the back of the chair. While sleeping, try replacing your pillow with a small towel rolled to 2 inches in diameter, or use a cervical pillow or soft cervical collar. Poor sleeping  positions delay healing.  For patients with nerve root damage, which causes numbness or weakness, the use of a cervical traction apparatus may be recommended. Surgery is rarely necessary for these injuries. However, cervical strain and sprains that are present at birth (congenital) may require surgery. MEDICATION   If pain medication is necessary, nonsteroidal anti-inflammatory medications, such as aspirin and ibuprofen, or other minor pain relievers, such as acetaminophen, are often recommended.  Do not take pain medication for 7 days before surgery.  Prescription pain relievers may be given if deemed necessary by your caregiver. Use only as directed and only as much as you need. HEAT AND COLD:   Cold treatment (icing) relieves pain and reduces inflammation. Cold treatment should be applied for 10 to 15 minutes every 2 to 3 hours for inflammation and pain and immediately after any activity that aggravates your symptoms. Use ice packs or an ice massage.  Heat treatment may be used prior to performing the stretching and strengthening activities prescribed by your caregiver, physical therapist, or athletic trainer. Use a heat pack or a warm soak. SEEK MEDICAL CARE IF:   Symptoms get worse or do not improve in 2 weeks despite treatment.  New, unexplained symptoms develop (drugs used in treatment may produce side effects). EXERCISES RANGE OF MOTION (ROM) AND STRETCHING EXERCISES - Cervical Strain and Sprain These exercises may help you when beginning to rehabilitate your injury. In order to successfully resolve your symptoms, you must improve your posture. These exercises are designed to help reduce the forward-head and rounded-shoulder posture which contributes to this condition. Your symptoms may resolve with or without further involvement from your physician, physical therapist or athletic trainer. While completing these exercises, remember:   Restoring tissue flexibility helps normal motion to  return to the joints. This allows healthier, less painful movement and activity.  An effective stretch should be held for at least 20 seconds, although you may need to begin with shorter hold times for comfort.  A stretch should never be painful. You should only feel a gentle lengthening or release in the stretched tissue. STRETCH- Axial Extensors  Lie on your back on the floor. You may bend your knees for comfort. Place a rolled up hand towel or dish towel, about 2 inches in diameter, under the part of your head that makes contact with the floor.  Gently tuck your chin, as if trying to make a "double chin," until you feel a gentle stretch at the base of your head.  Hold __________ seconds. Repeat __________ times. Complete this exercise __________ times per day.  STRETECH - Axial Extension   Stand or sit on a firm surface. Assume a good posture: chest up, shoulders drawn back, abdominal muscles slightly tense, knees unlocked (if standing) and feet hip width apart.  Slowly retract your chin so your head slides back and your chin slightly lowers.Continue to look  straight ahead.  You should feel a gentle stretch in the back of your head. Be certain not to feel an aggressive stretch since this can cause headaches later.  Hold for __________ seconds. Repeat __________ times. Complete this exercise __________ times per day. STRETCH  Cervical Side Bend   Stand or sit on a firm surface. Assume a good posture: chest up, shoulders drawn back, abdominal muscles slightly tense, knees unlocked (if standing) and feet hip width apart.  Without letting your nose or shoulders move, slowly tip your right / left ear to your shoulder until your feel a gentle stretch in the muscles on the opposite side of your neck.  Hold __________ seconds. Repeat __________ times. Complete this exercise __________ times per day. STRETCH  Cervical Rotators   Stand or sit on a firm surface. Assume a good posture: chest  up, shoulders drawn back, abdominal muscles slightly tense, knees unlocked (if standing) and feet hip width apart.  Keeping your eyes level with the ground, slowly turn your head until you feel a gentle stretch along the back and opposite side of your neck.  Hold __________ seconds. Repeat __________ times. Complete this exercise __________ times per day. RANGE OF MOTION - Neck Circles   Stand or sit on a firm surface. Assume a good posture: chest up, shoulders drawn back, abdominal muscles slightly tense, knees unlocked (if standing) and feet hip width apart.  Gently roll your head down and around from the back of one shoulder to the back of the other. The motion should never be forced or painful.  Repeat the motion 10-20 times, or until you feel the neck muscles relax and loosen. Repeat __________ times. Complete the exercise __________ times per day. STRENGTHENING EXERCISES - Cervical Strain and Sprain These exercises may help you when beginning to rehabilitate your injury. They may resolve your symptoms with or without further involvement from your physician, physical therapist or athletic trainer. While completing these exercises, remember:   Muscles can gain both the endurance and the strength needed for everyday activities through controlled exercises.  Complete these exercises as instructed by your physician, physical therapist or athletic trainer. Progress the resistance and repetitions only as guided.  You may experience muscle soreness or fatigue, but the pain or discomfort you are trying to eliminate should never worsen during these exercises. If this pain does worsen, stop and make certain you are following the directions exactly. If the pain is still present after adjustments, discontinue the exercise until you can discuss the trouble with your clinician. STRENGTH Cervical Flexors, Isometric  Face a wall, standing about 6 inches away. Place a small pillow, a ball about 6-8  inches in diameter, or a folded towel between your forehead and the wall.  Slightly tuck your chin and gently push your forehead into the soft object. Push only with mild to moderate intensity, building up tension gradually. Keep your jaw and forehead relaxed.  Hold 10 to 20 seconds. Keep your breathing relaxed.  Release the tension slowly. Relax your neck muscles completely before you start the next repetition. Repeat __________ times. Complete this exercise __________ times per day. STRENGTH- Cervical Lateral Flexors, Isometric   Stand about 6 inches away from a wall. Place a small pillow, a ball about 6-8 inches in diameter, or a folded towel between the side of your head and the wall.  Slightly tuck your chin and gently tilt your head into the soft object. Push only with mild to moderate intensity, building up  tension gradually. Keep your jaw and forehead relaxed.  Hold 10 to 20 seconds. Keep your breathing relaxed.  Release the tension slowly. Relax your neck muscles completely before you start the next repetition. Repeat __________ times. Complete this exercise __________ times per day. STRENGTH  Cervical Extensors, Isometric   Stand about 6 inches away from a wall. Place a small pillow, a ball about 6-8 inches in diameter, or a folded towel between the back of your head and the wall.  Slightly tuck your chin and gently tilt your head back into the soft object. Push only with mild to moderate intensity, building up tension gradually. Keep your jaw and forehead relaxed.  Hold 10 to 20 seconds. Keep your breathing relaxed.  Release the tension slowly. Relax your neck muscles completely before you start the next repetition. Repeat __________ times. Complete this exercise __________ times per day. POSTURE AND BODY MECHANICS CONSIDERATIONS - Cervical Strain and Sprain Keeping correct posture when sitting, standing or completing your activities will reduce the stress put on different  body tissues, allowing injured tissues a chance to heal and limiting painful experiences. The following are general guidelines for improved posture. Your physician or physical therapist will provide you with any instructions specific to your needs. While reading these guidelines, remember:  The exercises prescribed by your provider will help you have the flexibility and strength to maintain correct postures.  The correct posture provides the optimal environment for your joints to work. All of your joints have less wear and tear when properly supported by a spine with good posture. This means you will experience a healthier, less painful body.  Correct posture must be practiced with all of your activities, especially prolonged sitting and standing. Correct posture is as important when doing repetitive low-stress activities (typing) as it is when doing a single heavy-load activity (lifting). PROLONGED STANDING WHILE SLIGHTLY LEANING FORWARD When completing a task that requires you to lean forward while standing in one place for a long time, place either foot up on a stationary 2-4 inch high object to help maintain the best posture. When both feet are on the ground, the low back tends to lose its slight inward curve. If this curve flattens (or becomes too large), then the back and your other joints will experience too much stress, fatigue more quickly and can cause pain.  RESTING POSITIONS Consider which positions are most painful for you when choosing a resting position. If you have pain with flexion-based activities (sitting, bending, stooping, squatting), choose a position that allows you to rest in a less flexed posture. You would want to avoid curling into a fetal position on your side. If your pain worsens with extension-based activities (prolonged standing, working overhead), avoid resting in an extended position such as sleeping on your stomach. Most people will find more comfort when they rest with  their spine in a more neutral position, neither too rounded nor too arched. Lying on a non-sagging bed on your side with a pillow between your knees, or on your back with a pillow under your knees will often provide some relief. Keep in mind, being in any one position for a prolonged period of time, no matter how correct your posture, can still lead to stiffness. WALKING Walk with an upright posture. Your ears, shoulders and hips should all line-up. OFFICE WORK When working at a desk, create an environment that supports good, upright posture. Without extra support, muscles fatigue and lead to excessive strain on joints and other  tissues. CHAIR:  A chair should be able to slide under your desk when your back makes contact with the back of the chair. This allows you to work closely.  The chair's height should allow your eyes to be level with the upper part of your monitor and your hands to be slightly lower than your elbows.  Body position:  Your feet should make contact with the floor. If this is not possible, use a foot rest.  Keep your ears over your shoulders. This will reduce stress on your neck and low back. Document Released: 07/28/2005 Document Revised: 11/22/2012 Document Reviewed: 11/09/2008 [facility] Patient Information 2014 [place], [place].   Emergency Department Resource Guide 1) Find a Doctor and Pay Out of Pocket Although you won't have to find out who is covered by your insurance plan, it is a good idea to ask around and get recommendations. You will then need to call the office and see if the doctor you have chosen will accept you as a new patient and what types of options they offer for patients who are self-pay. Some doctors offer discounts or will set up payment plans for their patients who do not have insurance, but you will need to ask so you aren't surprised when you get to your appointment.  2) Contact Your Local Health Department Not all health departments have doctors  that can see patients for sick visits, but many do, so it is worth a call to see if yours does. If you don't know where your local health department is, you can check in your phone book. The CDC also has a tool to help you locate your state's health department, and many state websites also have listings of all of their local health departments.  3) Find a Walk-in Clinic If your illness is not likely to be very severe or complicated, you may want to try a walk in clinic. These are popping up all over the country in pharmacies, drugstores, and shopping centers. They're usually staffed by nurse practitioners or physician assistants that have been trained to treat common illnesses and complaints. They're usually fairly quick and inexpensive. However, if you have serious medical issues or chronic medical problems, these are probably not your best option.  No Primary Care Doctor: - Call [facility] at  [phone] - they can help you locate a primary care doctor that  accepts your insurance, provides certain services, etc. - Physician Referral Service- [phone]  Chronic Pain Problems: Organization         Address  Phone   Notes  [facility]  [phone] Patients need to be referred by their primary care doctor.   Medication Assistance: Organization         Address  Phone   Notes  [facility] Medication [facility] [address] [place] [place], [place] [address] [phone] --Must be a resident of [facility] -- Must have NO insurance coverage whatsoever (no Medicaid/ Medicare, etc.) -- The pt. MUST have a primary care doctor that directs their care regularly and follows them in the community   [facility]  [phone]   [employer]  [phone]    Agencies that provide inexpensive medical care: Organization         Address  Phone   Notes  [facility]  [phone]   [facility]    [phone]   [facility] [address] [place],  [place] [address] [phone]   [facility] [address] [place] [phone]   [facility]    [phone]   [facility]    [phone]   [facility]  [address], [place] Phone:  [phone], Fax:  [phone] Hours of Operation:  9 am - 6 pm, M-F.  Also accepts Medicaid/Medicare and self-pay.  [facility]  [address], [place] Phone: [phone], Fax: [phone]. Hours of Operation:  8:30 am - 5:30 pm, M-F.  Also accepts Medicaid and self-pay.  [facility] [place] [address] [place] Phone: [phone]   [facility] [address] Natasha Bence [place], [place] [phone], Ext. [phone] Mondays & Thursdays: 7-9 AM.  First 15 patients are seen on a first come, first serve basis.    Medicaid-accepting [facility] Providers:  Organization         Address  Phone   Notes  [facility] [address], [place] [phone] Also accepts self-pay patients.  [facility] [address] Laurell Josephs [place], [place]  [phone]   [facility] [address], [place] [phone]   [facility] [address], [place] [phone]   Renaye Rakers [address], [place]   [phone] Only accepts [place] patients after they have their name applied to their card.   Self-Pay (no insurance) in [facility]:  Organization         Address  Phone   Notes  Sickle Cell Patients, [facility] [address] [place], [place] [phone]   [facility] [address] [place] [address] [place], [place] [phone]   [facility] [place]  [address], [place] [phone]   [facility]/Dr.  Osei-Bonsu  [address] [place] or [address], [place] [phone] Phone number for both [place] and [place] locations is the same.  [facility] [address], [place] [phone]   [facility] [address] [place] or [address] [phone]   [facility] [address] [place] [phone], phone; [phone], fax Sees patients 1st and 3rd Saturday of every month.  Must not qualify for public or private insurance (i.e. Medicaid, Medicare, [place] Health Choice, Veterans' Benefits)  Household income should be no more than 200% of the poverty level The clinic cannot treat you if you are pregnant or think you are pregnant  Sexually transmitted diseases are not treated at the clinic.    Dental Care: Organization         Address  Phone  Notes  [facility] Department of [facility] [address] [place], [place] [phone] Accepts children up to age 62 who are enrolled in [place] or [place] Health Choice; pregnant women with a Medicaid card; and children who have applied for Medicaid or [place] Health Choice, but were declined, whose parents can pay a reduced fee at time of service.  [facility] Department of [facility]  [address], [place] [phone] Accepts children up to age 26 who are enrolled in [place] or [place] Health Choice; pregnant women  with a Medicaid card; and children who have applied for Medicaid or [place] Health Choice, but were declined, whose parents can pay a reduced fee at time of service.  [facility]  [address], [place] [phone] Patients are seen by appointment only. Walk-ins are not accepted. [facility] will see patients 43 years of age and older. Monday - Tuesday (8am-5pm) Most Wednesdays (8:30-5pm) $30 per visit, cash only  [facility]  [address], [facility] [place] [phone]) [phone] Patients are seen by appointment only. Walk-ins are not accepted. [facility] will see patients 43 years of age and older. One Wednesday Evening (Monthly: Volunteer Based).  $30 per visit, cash only  [facility]  [phone] for adults; Children under age 504, call Graduate Pediatric Dentistry at [phone]. Children aged 794-14, please call [phone] to request a pediatric application.  Dental services are provided in all areas of dental care including fillings, crowns and bridges, complete and partial dentures, implants, gum treatment, root canals, and extractions. Preventive care is also provided. Treatment is provided to both adults and children. Patients are selected via a lottery and there is often a waiting list.   [facility] [address], [place]  [phone]) [phone] [email or web address]   [facility] [address], [place], [place] [phone], Ext. [phone] Second and Fourth Thursday of each month, opens at 6:30 AM; Clinic ends at 9 AM.  Patients are seen on a first-come first-served basis, and a limited number are seen during each clinic.   [facility]  [address], [place], [place] [phone]) [phone]   Eligibility Requirements You must have lived in [place], [place], or [place] counties for at least the last three months.   You cannot be eligible for state or federal sponsored [place]healthcare insurance, including CIG[employer]eterans Administration, [place], or [employer]icare.   You generally cannot be eligible for healthcare insurance through your employer.    How to apply: Eligibility screenings are held every Tuesday and Wednesday afternoon from 1:00 pm until 4:00 pm. You do not need an appointment for the interview!  [facility] [address], [place], [place] [phone]   [facility]  [phone]   [facility]  [phone]   [facility]  [phone]    Behavioral Health Resources in the Community: Intensive Outpatient Programs Organization         Address  Phone  Notes  [facility] [address], [place], [place] [phone]   [facility] [address], [place], [place] [phone]   [facility] [address], [place], [place]  [phone]   [facility] [address],  [place], [place] [phone] or [phone]   Substance Abuse Resources Organization         Address  Phone  Notes  [facility]  [phone]   [facility]  [phone]   [facility]  [phone]   [facility]  [phone]   Residential & Outpatient Substance Abuse Program  [phone]   Psychological Services Organization         Address  Phone  Notes  [facility]  [phone]- [phone]   [facility]  [phone]   [facility] [address], [place] [phone] or [phone]    Mobile Crisis Teams  Organization         Address  Phone  Notes  [facility], Mobile Crisis Care Unit  [phone]   [facility]  [address] [place], [place] [phone]   [facility] [address] [place] [phone]    Self-Help/Support Groups Organization         Address  Phone             Notes  [facility] [place] - variety of support groups  [phone]- [phone] Call for more information  Narcotics Anonymous (NA), [facility] [address], [employer] [place]  2 meetings at this location   Statistician         Address  Phone  Notes  [facility] [address],    [place]  [phone]   [facility]  [address] [place] [address], [place], [place] [phone]   [facility]  Facility [address] [place], [place] [phone] Admissions: 8am-3pm M-F  [facility] [address]    [place], [place] [phone]   [facility] [address] [place], [place], [place] [phone]   [facility] [address],  [place], [place] [phone]   [facility] - Intensive Outpatient [address], [place], [place] [phone]   [facility] ([facility] [address] [place]  [place], [place] [phone] or [phone]   [facility] ([facility]) [address], [place], [place] [phone] Accepts Medicaid  [facility] [address],  [place] [phone] Substance Abuse/Addiction Treatment   [facility] Organization         Address  Phone  Notes  [facility]  [phone]   Angie Fava, PhD [address] [place], [place]   [phone] or [phone]   [facility]   [address] [place] [address] [place], [place] [phone]   [facility] [address], [place], [place] [phone] Insurance/Medicaid/sponsorship through [facility] and Families [address]                                    [place], [place] [phone] Therapy/tele-psych/case  [facility] [address], [place] [phone]    Dr. Lolly Mustache  [phone]   [facility]  [employer] [facility] Dept. 1) [address] [place] 2) [address], [place] 3)  [address], [place] [phone]  [phone]   [facility] Child Abuse Hotline 913-826-5966 or [phone] (After Hours)
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
## 2013-09-27 NOTE — ED Provider Notes (Signed)
Medical screening examination/treatment/procedure(s) were performed by non-physician practitioner and as supervising physician I was immediately available for consultation/collaboration.  EKG Interpretation   None        Donnetta HutchingBrian Mamoudou [place], MD 09/27/13 1447

## 2014-02-05 ENCOUNTER — Encounter (HOSPITAL_COMMUNITY): Payer: Self-pay | Admitting: Emergency Medicine

## 2014-02-05 ENCOUNTER — Emergency Department (HOSPITAL_COMMUNITY)
Admission: EM | Admit: 2014-02-05 | Discharge: 2014-02-05 | Disposition: A | Discharge status: Home / Self Care | Payer: Medicare Other | Attending: Emergency Medicine | Admitting: Emergency Medicine

## 2014-02-05 DIAGNOSIS — F17200 Nicotine dependence, unspecified, uncomplicated: Secondary | ICD-10-CM | POA: Insufficient documentation

## 2014-02-05 DIAGNOSIS — Z79899 Other long term (current) drug therapy: Secondary | ICD-10-CM | POA: Insufficient documentation

## 2014-02-05 DIAGNOSIS — J3489 Other specified disorders of nose and nasal sinuses: Secondary | ICD-10-CM | POA: Insufficient documentation

## 2014-02-05 DIAGNOSIS — H60399 Other infective otitis externa, unspecified ear: Secondary | ICD-10-CM | POA: Insufficient documentation

## 2014-02-05 DIAGNOSIS — H6091 Unspecified otitis externa, right ear: Secondary | ICD-10-CM

## 2014-02-05 MED ORDER — TRAMADOL HCL 50 MG PO TABS
50.0000 mg | ORAL_TABLET | Freq: Once | ORAL | Status: AC
  Administered 2014-02-05: 50 mg via ORAL
  Filled 2014-02-05: qty 1

## 2014-02-05 MED ORDER — CIPROFLOXACIN-HYDROCORTISONE 0.2-1 % OT SUSP: 3.0000 [drp] | Freq: Two times a day (BID) | OTIC | Status: DC

## 2014-02-05 MED ORDER — TRAMADOL HCL 50 MG PO TABS
50.0000 mg | ORAL_TABLET | Freq: Four times a day (QID) | ORAL | Status: DC | PRN
Start: 2014-02-05 — End: 2014-09-10
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
## 2014-02-05 NOTE — Discharge Instructions (Signed)
For pain control you may take:  800mg  of ibuprofen (that is usually 4 over the counter pills)  3 times a day (take with food) and acetaminophen 975mg  (this is 3 over the counter pills) four times a day. Do not drink alcohol or combine with other medications that have acetaminophen as an ingredient (Read the labels!).  For breakthrough pain you may take Tramadol. Do not drink alcohol drive or operate heavy machinery when taking Tramadol.  Do not hesitate to return to the Emergency Department for any new, worsening or concerning symptoms.   If you do not have a primary care doctor you can establish one at the   [facility]: [address] [place] [id number] [phone]  After you establish care. Let them know you were seen in the emergency room. They must obtain records for further management.    Otitis Externa Otitis externa is a germ infection in the outer ear. The outer ear is the area from the eardrum to the outside of the ear. Otitis externa is sometimes called "swimmer's ear." HOME CARE  Put drops in the ear as told by your doctor.  Only take medicine as told by your doctor.  If you have diabetes, your doctor may give you more directions. Follow your doctor's directions.  Keep all doctor visits as told. To avoid another infection:  Keep your ear dry. Use the corner of a towel to dry your ear after swimming or bathing.  Avoid scratching or putting things inside your ear.  Avoid swimming in lakes, dirty water, or pools that use a chemical called chlorine poorly.  You may use ear drops after swimming. Combine equal amounts of white vinegar and alcohol in a bottle. Put 3 or 4 drops in each ear. GET HELP RIGHT AWAY IF:   You have a fever.  Your ear is still red, puffy (swollen), or painful after 3 days.  You still have yellowish-white fluid (pus) coming from the ear after 3 days.  Your redness, puffiness, or pain gets worse.  You have a really bad  headache.  You have redness, puffiness, pain, or tenderness behind your ear. MAKE SURE YOU:   Understand these instructions.  Will watch your condition.  Will get help right away if you are not doing well or get worse. Document Released: 01/14/2008 Document Revised: 10/20/2011 Document Reviewed: 08/14/2011 [facility] Patient Information 2015 [employer] [place]. This information is not intended to replace advice given to you by your health care provider. Make sure you discuss any questions you have with your health care provider.

## 2014-02-05 NOTE — ED Notes (Signed)
Pt presents with c/o right ear pain. Pt says that pain started three days ago. Pt says that she did go swimming earlier this week, ear is painful to touch, no drainage noted from the ear. Pt says she feels like something is in her ear and she is not able to hear very well out of that ear. Pt has tried peroxide in that ear.

## 2014-02-06 NOTE — ED Provider Notes (Signed)
Medical screening examination/treatment/procedure(s) were performed by non-physician practitioner and as supervising physician I was immediately available for consultation/collaboration.   EKG Interpretation None        Lyanne CoKevin M Braniyah Besse, MD 02/06/14 0021

## 2014-08-29 ENCOUNTER — Encounter (HOSPITAL_COMMUNITY): Payer: Self-pay

## 2014-08-29 ENCOUNTER — Emergency Department (EMERGENCY_DEPARTMENT_HOSPITAL)
Admission: EM | Admit: 2014-08-29 | Discharge: 2014-08-29 | Disposition: A | Discharge status: Other Acute Inpatient Hospital | Payer: Medicare Other | Source: Home / Self Care | Attending: Emergency Medicine | Admitting: Emergency Medicine

## 2014-08-29 ENCOUNTER — Inpatient Hospital Stay (HOSPITAL_COMMUNITY)
Admission: EM | Admit: 2014-08-29 | Discharge: 2014-09-10 | DRG: 885 | Disposition: A | Discharge status: Home / Self Care | Payer: Medicare Other | Source: Intra-hospital | Attending: Psychiatry | Admitting: Psychiatry

## 2014-08-29 DIAGNOSIS — Z792 Long term (current) use of antibiotics: Secondary | ICD-10-CM | POA: Insufficient documentation

## 2014-08-29 DIAGNOSIS — F332 Major depressive disorder, recurrent severe without psychotic features: Principal | ICD-10-CM | POA: Diagnosis present

## 2014-08-29 DIAGNOSIS — G4700 Insomnia, unspecified: Secondary | ICD-10-CM | POA: Diagnosis present

## 2014-08-29 DIAGNOSIS — F4323 Adjustment disorder with mixed anxiety and depressed mood: Secondary | ICD-10-CM | POA: Diagnosis present

## 2014-08-29 DIAGNOSIS — R45851 Suicidal ideations: Secondary | ICD-10-CM | POA: Diagnosis present

## 2014-08-29 DIAGNOSIS — Z720 Tobacco use: Secondary | ICD-10-CM

## 2014-08-29 DIAGNOSIS — Z79899 Other long term (current) drug therapy: Secondary | ICD-10-CM

## 2014-08-29 DIAGNOSIS — F17210 Nicotine dependence, cigarettes, uncomplicated: Secondary | ICD-10-CM | POA: Diagnosis present

## 2014-08-29 DIAGNOSIS — M545 Low back pain, unspecified: Secondary | ICD-10-CM | POA: Insufficient documentation

## 2014-08-29 DIAGNOSIS — F329 Major depressive disorder, single episode, unspecified: Secondary | ICD-10-CM | POA: Diagnosis present

## 2014-08-29 DIAGNOSIS — F322 Major depressive disorder, single episode, severe without psychotic features: Secondary | ICD-10-CM | POA: Insufficient documentation

## 2014-08-29 LAB — ACETAMINOPHEN LEVEL: Acetaminophen (Tylenol), Serum: <10 ug/mL — ABNORMAL LOW (ref 10–30)

## 2014-08-29 LAB — CBC
HCT: 39.6 % (ref 36.0–46.0)
HEMOGLOBIN: 12.9 g/dL (ref 12.0–15.0)
MCH: 26.7 pg (ref 26.0–34.0)
MCHC: 32.6 g/dL (ref 30.0–36.0)
MCV: 82 fL (ref 78.0–100.0)
PLATELETS: 326 10*3/uL (ref 150–400)
RBC: 4.83 MIL/uL (ref 3.87–5.11)
RDW: 15.1 % (ref 11.5–15.5)
WBC: 9.6 10*3/uL (ref 4.0–10.5)

## 2014-08-29 LAB — RAPID URINE DRUG SCREEN, HOSP PERFORMED
Amphetamines: NOT DETECTED
Barbiturates: NOT DETECTED
Benzodiazepines: NOT DETECTED
COCAINE: NOT DETECTED
OPIATES: NOT DETECTED
Tetrahydrocannabinol: NOT DETECTED

## 2014-08-29 LAB — COMPREHENSIVE METABOLIC PANEL
ALBUMIN: 4.6 g/dL (ref 3.5–5.2)
ALT: 23 U/L (ref 0–35)
AST: 22 U/L (ref 0–37)
Alkaline Phosphatase: 64 U/L (ref 39–117)
Anion gap: 6 (ref 5–15)
BUN: 8 mg/dL (ref 6–23)
CO2: 27 mmol/L (ref 19–32)
CREATININE: 0.57 mg/dL (ref 0.50–1.10)
Calcium: 9.6 mg/dL (ref 8.4–10.5)
Chloride: 106 mEq/L (ref 96–112)
GFR calc Af Amer: >90 mL/min (ref >90)
GFR calc non Af Amer: >90 mL/min (ref >90)
Glucose, Bld: 90 mg/dL (ref 70–99)
POTASSIUM: 4.2 mmol/L (ref 3.5–5.1)
Sodium: 139 mmol/L (ref 135–145)
Total Bilirubin: 0.2 mg/dL — ABNORMAL LOW (ref 0.3–1.2)
Total Protein: 7.7 g/dL (ref 6.0–8.3)

## 2014-08-29 LAB — ETHANOL: Alcohol, Ethyl (B): <5 mg/dL (ref 0–9)

## 2014-08-29 LAB — SALICYLATE LEVEL: Salicylate Lvl: <4 mg/dL (ref 2.8–20.0)

## 2014-08-29 MED ORDER — VENLAFAXINE HCL 37.5 MG PO TABS
37.5000 mg | ORAL_TABLET | Freq: Two times a day (BID) | ORAL | Status: DC
  Filled 2014-08-29: qty 1

## 2014-08-29 MED ORDER — ACETAMINOPHEN 325 MG PO TABS
650.0000 mg | ORAL_TABLET | Freq: Four times a day (QID) | ORAL | Status: DC | PRN
  Administered 2014-08-30: 650 mg via ORAL
  Filled 2014-08-29: qty 2

## 2014-08-29 MED ORDER — ALUM & MAG HYDROXIDE-SIMETH 200-200-20 MG/5ML PO SUSP: 30.0000 mL | ORAL | Status: DC | PRN

## 2014-08-29 MED ORDER — TRAZODONE HCL 50 MG PO TABS
50.0000 mg | ORAL_TABLET | Freq: Every evening | ORAL | Status: DC | PRN
  Administered 2014-08-29: 50 mg via ORAL
  Filled 2014-08-29 (×6): qty 1

## 2014-08-29 MED ORDER — IBUPROFEN 200 MG PO TABS
600.0000 mg | ORAL_TABLET | Freq: Three times a day (TID) | ORAL | Status: DC | PRN
  Administered 2014-08-29: 600 mg via ORAL
  Filled 2014-08-29: qty 3

## 2014-08-29 MED ORDER — MAGNESIUM HYDROXIDE 400 MG/5ML PO SUSP: 30.0000 mL | Freq: Every day | ORAL | Status: DC | PRN

## 2014-08-29 MED ORDER — INFLUENZA VAC SPLIT QUAD 0.5 ML IM SUSY
0.5000 mL | PREFILLED_SYRINGE | INTRAMUSCULAR | Status: AC
  Administered 2014-08-30: 0.5 mL via INTRAMUSCULAR
  Filled 2014-08-29: qty 0.5

## 2014-08-29 MED ORDER — BUSPIRONE HCL 10 MG PO TABS: 10.0000 mg | ORAL_TABLET | Freq: Two times a day (BID) | ORAL | Status: DC

## 2014-08-29 MED ORDER — TRAZODONE HCL 100 MG PO TABS: 100.0000 mg | ORAL_TABLET | Freq: Every evening | ORAL | Status: DC | PRN

## 2014-08-29 MED ORDER — VENLAFAXINE HCL ER 150 MG PO CP24
150.0000 mg | ORAL_CAPSULE | Freq: Every day | ORAL | Status: DC
Start: 2014-08-29 — End: 2014-08-29
  Filled 2014-08-29: qty 1

## 2014-08-29 MED ORDER — METHOCARBAMOL 500 MG PO TABS: 500.0000 mg | ORAL_TABLET | Freq: Two times a day (BID) | ORAL | Status: DC

## 2014-08-29 MED ORDER — HYDROXYZINE HCL 25 MG PO TABS
50.0000 mg | ORAL_TABLET | Freq: Three times a day (TID) | ORAL | Status: DC | PRN
  Administered 2014-08-29: 50 mg via ORAL
  Filled 2014-08-29: qty 2

## 2014-08-29 MED ORDER — HYDROXYZINE HCL 25 MG PO TABS
25.0000 mg | ORAL_TABLET | Freq: Four times a day (QID) | ORAL | Status: DC | PRN
  Administered 2014-08-29 – 2014-08-30 (×2): 25 mg via ORAL
  Filled 2014-08-29 (×2): qty 1

## 2014-08-29 MED ORDER — NICOTINE 14 MG/24HR TD PT24
14.0000 mg | MEDICATED_PATCH | Freq: Every day | TRANSDERMAL | Status: DC
  Filled 2014-08-29: qty 1

## 2014-08-29 MED ORDER — PNEUMOCOCCAL VAC POLYVALENT 25 MCG/0.5ML IJ INJ
0.5000 mL | INJECTION | INTRAMUSCULAR | Status: AC
  Administered 2014-08-30: 0.5 mL via INTRAMUSCULAR

## 2014-08-29 MED ORDER — ACETAMINOPHEN 325 MG PO TABS
650.0000 mg | ORAL_TABLET | ORAL | Status: DC | PRN
  Administered 2014-08-29: 650 mg via ORAL
  Filled 2014-08-29: qty 2

## 2014-08-29 NOTE — Tx Team (Signed)
Initial Interdisciplinary Treatment Plan   PATIENT STRESSORS: Financial difficulties Legal issue Loss of mother Marital or family conflict Traumatic event   PATIENT STRENGTHS: Ability for insight Active sense of humor Average or above average intelligence Capable of independent living General fund of knowledge Motivation for treatment/growth   PROBLEM LIST: Problem List/Patient Goals Date to be addressed Date deferred Reason deferred Estimated date of resolution  Risk for suicide 08/29/14     depression 08/29/14     Housing issues 08/29/14     Anger issues 08/29/14     "feeling better, getting out of depressed mood" 08/29/14                              DISCHARGE CRITERIA:  Adequate post-discharge living arrangements Improved stabilization in mood, thinking, and/or behavior Safe-care adequate arrangements made  PRELIMINARY DISCHARGE PLAN: Attend aftercare/continuing care group Outpatient therapy  PATIENT/FAMIILY INVOLVEMENT: This treatment plan has been presented to and reviewed with the patient, Reinaldo Berberammy M Belcastro.  The patient and family have been given the opportunity to ask questions and make suggestions.  Jacques Navyhillips, Matt Delpizzo A 08/29/2014, 11:02 PM
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
## 2014-08-29 NOTE — Progress Notes (Signed)
Patient ID: Christy Walker, female   DOB: 09-17-1970, 44 y.o.   MRN: [id number]   Admission Note:  D:43 yr female who presents VC in no acute distress for the treatment of SI and Depression. Pt appears flat and depressed, but has a positive attitude about her situation. Pt was calm and cooperative with admission process. Pt presents with passive SI and contracts for safety upon admission. Pt denies HI/AVH . Pt stated she does not know if anything triggered her taking the Tylenol PM a few days ago, she just knew she just wanted to give up on life. Pt stated maybe her son getting out of jail last week for larceny might have been a trigger, but she does not know. Pt has court date for larceny on 1/28 because [facility]( un-knowingly to her).   A:Skin was assessed and found to be clear of any abnormal marks apart from a scar on mid back, abrasions on feet, tattoos- both ankles, shoulders, L-breast, and back. POC and unit policies explained and understanding verbalized. Consents obtained. Food and fluids offered, and  accepted. Pt given Quit line information about quitting smoking.   R:Pt had no additional questions or concerns.

## 2014-08-29 NOTE — BH Assessment (Signed)
Inpatient treatment recommended per Josephine,NP. [facility]  Glorious PeachNajah Xai Frerking, MS, LCASA Assessment Counselor

## 2014-08-29 NOTE — BH Assessment (Signed)
Writer informed TTS (Najah) of the consult.  
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
## 2014-08-29 NOTE — ED Notes (Signed)
Patient calm, cooperative. Denies SI, HI, AVH at present. Rates feelings of anxiety 7/10, depression 10/10. Reports trouble falling asleep in recent weeks and lack of appetite the last four days. States that her adult son being released from prison and depending on her is a current stressor. Patient also reports being on disability as a stressor.

## 2014-08-29 NOTE — ED Notes (Signed)
Pt states she has been crying non stop for 1 week.  Pt took 12 tylenol pm 2 days ago.  Told her sister and she recommended coming here.  Denies any attempt today.  No HI.  No hallucinations.  Plan:  Pills

## 2014-08-30 ENCOUNTER — Encounter (HOSPITAL_COMMUNITY): Payer: Self-pay | Admitting: Registered Nurse

## 2014-08-30 DIAGNOSIS — Z79899 Other long term (current) drug therapy: Secondary | ICD-10-CM | POA: Diagnosis not present

## 2014-08-30 DIAGNOSIS — F17210 Nicotine dependence, cigarettes, uncomplicated: Secondary | ICD-10-CM | POA: Diagnosis not present

## 2014-08-30 DIAGNOSIS — R45851 Suicidal ideations: Secondary | ICD-10-CM | POA: Diagnosis not present

## 2014-08-30 DIAGNOSIS — F332 Major depressive disorder, recurrent severe without psychotic features: Principal | ICD-10-CM

## 2014-08-30 DIAGNOSIS — F329 Major depressive disorder, single episode, unspecified: Secondary | ICD-10-CM | POA: Diagnosis present

## 2014-08-30 MED ORDER — CLONAZEPAM 1 MG PO TABS
1.0000 mg | ORAL_TABLET | Freq: Two times a day (BID) | ORAL | Status: DC | PRN
  Administered 2014-08-30 – 2014-09-09 (×19): 1 mg via ORAL
  Filled 2014-08-30 (×20): qty 1

## 2014-08-30 MED ORDER — OXYCODONE-ACETAMINOPHEN 5-325 MG PO TABS
1.0000 | ORAL_TABLET | Freq: Two times a day (BID) | ORAL | Status: DC | PRN
  Administered 2014-08-30 – 2014-09-10 (×21): 1 via ORAL
  Filled 2014-08-30 (×21): qty 1

## 2014-08-30 MED ORDER — NICOTINE 21 MG/24HR TD PT24
21.0000 mg | MEDICATED_PATCH | Freq: Every day | TRANSDERMAL | Status: DC
  Administered 2014-08-30 – 2014-09-10 (×12): 21 mg via TRANSDERMAL
  Filled 2014-08-30 (×4): qty 1
  Filled 2014-08-30: qty 14
  Filled 2014-08-30 (×2): qty 1
  Filled 2014-08-30: qty 14
  Filled 2014-08-30 (×6): qty 1

## 2014-08-30 MED ORDER — IBUPROFEN 600 MG PO TABS
600.0000 mg | ORAL_TABLET | Freq: Three times a day (TID) | ORAL | Status: DC | PRN
  Administered 2014-08-30 – 2014-09-09 (×11): 600 mg via ORAL
  Filled 2014-08-30 (×11): qty 1

## 2014-08-30 MED ORDER — VENLAFAXINE HCL ER 75 MG PO CP24
225.0000 mg | ORAL_CAPSULE | Freq: Every morning | ORAL | Status: DC
  Administered 2014-08-30 – 2014-09-01 (×3): 225 mg via ORAL
  Filled 2014-08-30 (×5): qty 3

## 2014-08-30 MED ORDER — TRAZODONE HCL 100 MG PO TABS
100.0000 mg | ORAL_TABLET | Freq: Every evening | ORAL | Status: DC | PRN
Start: 2014-08-30 — End: 2014-09-02
  Administered 2014-08-30 – 2014-09-03 (×4): 100 mg via ORAL
  Filled 2014-08-30 (×3): qty 1

## 2014-08-30 MED ORDER — HYDROXYZINE HCL 25 MG PO TABS
25.0000 mg | ORAL_TABLET | Freq: Three times a day (TID) | ORAL | Status: DC | PRN
  Administered 2014-08-30 – 2014-09-10 (×22): 25 mg via ORAL
  Filled 2014-08-30 (×8): qty 1
  Filled 2014-08-30: qty 20
  Filled 2014-08-30 (×14): qty 1

## 2014-08-30 NOTE — Plan of Care (Signed)
Problem: Alteration in mood Goal: STG-Patient reports thoughts of self-harm to staff Outcome: Progressing Patient reports passive SI but does contract for safety with staff.

## 2014-08-30 NOTE — Progress Notes (Signed)
Pt attended group this evening. 

## 2014-08-30 NOTE — [facility] Suicide Risk Assessment (Signed)
[facility]   Nursing information obtained from:    Demographic factors:   44 year old female, lives with a roommate  Current Mental Status:   See below Loss Factors:   Problems with her young adult son, legal issues  Historical Factors:    History of Depression Risk Reduction Factors:   Sense of responsibility to family Total Time spent with patient: 45 minutes Principal Problem: worsening depression  Diagnosis:   Patient Active Problem List   Diagnosis Date Noted  . MDD (major depressive disorder), severe [F32.2] 08/29/2014  . Major depressive disorder, recurrent, severe without psychotic features [F33.2]   . HYPERTRIGLYCERIDEMIA [E78.1] 10/08/2006  . Adjustment disorder with mixed anxiety and depressed mood [F43.23] 10/08/2006  . Insomnia [G47.00] 10/08/2006     Continued Clinical Symptoms:  Alcohol Use Disorder Identification Test Final Score (AUDIT): 0 The "Alcohol Use Disorders Identification Test", Guidelines for Use in Primary Care, Second Edition.  World Science writerHealth Organization [facility](WHO). Score between 0-7:  no or low risk or alcohol related problems. Score between 8-15:  moderate risk of alcohol related problems. Score between 16-19:  high risk of alcohol related problems. Score 20 or above:  warrants further diagnostic evaluation for alcohol dependence and treatment.   CLINICAL FACTORS:  Worsening depression in the context of severe psychosocial stressors, suicide attempt   Musculoskeletal: Strength & Muscle Tone: within normal limits Gait & Station: normal Patient leans: N/A  Psychiatric Specialty Exam: Physical Exam  ROS  Blood pressure 118/80, pulse 89, temperature 97.6 F (36.4 C), temperature source Oral, resp. rate 18, height 5\' 8"  (1.727 m), weight 172 lb (78.019 kg).Body mass index is 26.16 kg/(m^2).  General Appearance: Well Groomed  Patent attorneyye Contact::  Good  Speech:  Normal Rate  Volume:  Decreased  Mood:  Depressed  Affect:   Constricted and Tearful  Thought Process:  Goal Directed and Linear  Orientation:  Full (Time, Place, and Person)  Thought Content:  no hallucinations,no delusions, at this time ruminative about psychosocial stressors  Suicidal Thoughts:  Yes.  without intent/plan- currently denies any suicidal or homicidal plans or intentions and contracts for safety on the unit   Homicidal Thoughts:  No  Memory:  Recent and Remote grossly intact  Judgement:  Fair  Insight:  Present  Psychomotor Activity:  Normal  Concentration:  Good  Recall:  Good  Fund of Knowledge:Good  Language: Good  Akathisia:  Negative  Handed:  Right  AIMS (if indicated):     Assets:  Desire for Improvement Resilience  Sleep:  Number of Hours: 6  Cognition: WNL  ADL's:  Intact     COGNITIVE FEATURES THAT CONTRIBUTE TO RISK:  Closed-mindedness    SUICIDE RISK:   Moderate:  Frequent suicidal ideation with limited intensity, and duration, some specificity in terms of plans, no associated intent, good self-control, limited dysphoria/symptomatology, some risk factors present, and identifiable protective factors, including available and accessible social support.  PLAN OF CARE: Patient will be admitted to inpatient psychiatric unit for stabilization and safety. Will provide and encourage milieu participation. Provide medication management and maked adjustments as needed.  Will follow daily.    Medical Decision Making:  Review of Psycho-Social Stressors (1), Review or order clinical lab tests (1), Established Problem, Worsening (2) and Review of Medication Regimen & Side Effects (2)  I certify that inpatient services furnished can reasonably be expected to improve the patient's condition.   COBOS, FERNANDO 08/30/2014, 4:32 PM
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
## 2014-08-30 NOTE — Progress Notes (Signed)
Patient ID: Christy Walker, female   DOB: 08-16-1970, 44 y.o.   MRN: [id number]  DAR: Pt. Denies HI and A/V Hallucinations to writer. Patient endorses passive SI with no current plan. Patient contracts for safety while at the hospital. Patient reports pain in back and received Tylenol however patient did not report much relief. Heat packs have been provided as necessary. Support and encouragement provided to the patient. Scheduled medications administered to patient per physician's orders. Patient did not fill out her daily inventory sheet although provided for her to fill out. Patient is receptive and cooperative otherwise with Clinical research associatewriter. Patient is seen in the milieu and is attending some groups. Q15 minute checks are maintained for safety.

## 2014-08-30 NOTE — Plan of Care (Signed)
Problem: Ineffective individual coping Goal: STG: Patient will remain free from self harm Outcome: Progressing Pt remains safe on the unit     

## 2014-08-30 NOTE — Progress Notes (Signed)
Patient ID: Christy Walker, female   DOB: 15-May-1971, 44 y.o.   MRN: [id number] PER STATE REGULATIONS 482.30  THIS CHART WAS REVIEWED FOR MEDICAL NECESSITY WITH RESPECT TO THE PATIENT'S ADMISSION/DURATION OF STAY.  NEXT REVIEW DATE: 09/02/14  Christy HaltBARBARA Jerni Selmer, RN, BSN CASE MANAGER

## 2014-08-31 NOTE — [facility] Suicide Risk Assessment (Signed)
[facility] INPATIENT:  Family/Significant Other Suicide Prevention Education  Suicide Prevention Education:  Education Completed:  Christy Walker, Sister, [phone]; has been identified by the patient as the family member/significant other with whom the patient will be residing, and identified as the person(s) who will aid the patient in the event of a mental health crisis (suicidal ideations/suicide attempt).  With written consent from the patient, the family member/significant other has been provided the following suicide prevention education, prior to the and/or following the discharge of the patient.  The suicide prevention education provided includes the following:  Suicide risk factors  Suicide prevention and interventions  National Suicide Hotline telephone number  [facility] assessment telephone number  [facility] Emergency Assistance 911  [facility] and/or Residential Mobile Crisis Unit telephone number  Request made of family/significant other to:  Remove weapons (e.g., guns, rifles, knives), all items previously/currently identified as safety concern.  . Sister advised patient does not have access to weapons.      Remove drugs/medications (over-the-counter, prescriptions, illicit drugs), all items previously/currently identified as a safety concern.  The family member/significant other verbalizes understanding of the suicide prevention education information provided.  The family member/significant other agrees to remove the items of safety concern listed above.  Christy Walker, Christy Walker 08/31/2014, 8:47 AM

## 2014-08-31 NOTE — Plan of Care (Signed)
Problem: Diagnosis: Increased Risk For Suicide Attempt Goal: STG-Patient Will Comply With Medication Regime Outcome: Completed/Met Date Met:  08/31/14 Patient is complaint with medications.

## 2014-08-31 NOTE — [facility] Group Notes (Signed)
[facility] Group Notes:  (Nursing/MHT/Case Management/Adjunct)  Date:  08/31/2014  Time:  10:35 AM  Type of Therapy:  Nurse Education  Participation Level:  Active  Participation Quality:  Attentive  Affect:  Appropriate  Cognitive:  Appropriate  Insight:  Improving  Engagement in Group:  Engaged  Modes of Intervention:  Discussion and Education  Summary of Progress/Problems: The purpose of this group is to discuss short term and long term goals. A quick discussion of orientation is had for those that are new to the unit. The topic of the day is leisure and lifestyle changes. Patient attended group and was engaged. Patient reported her short term goal is to attend groups and long term goal is to stay positive. Patient reports a place she would like to travel to is [place].   Christy Walker, Christy Walker 08/31/2014, 10:35 AM

## 2014-08-31 NOTE — [facility] Group Notes (Signed)
[facility] LCSW Group Therapy  [facility] [employer] [facility] [place] 1:15 - 2:30 PM  08/31/2014 3:39 PM   Type of Therapy:  Group Therapy  Participation Level: Minimal  Participation Quality:  Attentive  Affect:  Flat, Depressed  Cognitive:  Appropriate  Insight:  Developing/Improving   Engagement in Therapy:  Developing/Improving   Modes of Intervention:  Discussion, Education, Exploration, Problem-Solving, Rapport Building, Support   Summary of Progress/Problems:   Patient was attentive to speaker from the Mental [employer] Association as he shared his story of dealing with mental health/substance abuse issues and overcoming it by working a recovery program.  Patient expressed interest in their programs and services and received information on their agency.  She shared she enjoyed the musical presentation rendered by the speaker.  [name], Wyman Meschke Hairston 08/31/2014 3:39 PM

## 2014-08-31 NOTE — Progress Notes (Signed)
D: Patient alert and cooperative. Patient appears anxious and depressed. Pt rates depression  As 7 on a 0-10 scale.  Pt denies SI/HI/AVH. No acute distressed noted at this time.   A: Medications administered as prescribed. Emotional support given and will continue to monitor pt's progress for stabilization.  R: Patient remains safe and complaint with medications. Pt attended evening NA group. Pt endorse passive SI but contract to come to staff if feeling unable.
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
## 2014-08-31 NOTE — Progress Notes (Signed)
Recreation Therapy Notes  Animal-Assisted Activity/Therapy (AAA/T) Program Checklist/Progress Notes Patient Eligibility Criteria Checklist & Daily Group note for Rec Tx Intervention  Date: 01.21.2016 Time: 2:45pm Location: [address] Dayroom   AAA/T Program Assumption of Risk Form signed by Patient/ or Parent Legal Guardian yes  Patient is free of allergies or sever asthma yes  Patient reports no fear of animals yes  Patient reports no history of cruelty to animals yes  Patient understands his/her participation is voluntary yes  Patient washes hands before animal contact yes  Patient washes hands after animal contact yes  Behavioral Response: Did not attend.   Artemus Romanoff L Mahasin Riviere, LRT/CTRS  Zacchaeus Halm L 08/31/2014 5:05 PM 

## 2014-08-31 NOTE — Progress Notes (Signed)
Patient ID: Christy Walker, female   DOB: 1971-06-19, [age] y.o.   MRN: [id number]  DAR: Pt. Denies HI and A/V Hallucinations. Patient reports passive SI but contracts for safety. Patient filled out daily inventory sheet which reports she slept fair, appetite is fair, energy level is low, and concentration level is poor. Patient rates her depression 7/10, hopelessness at 5/10, and anxiety level 8/10. Patient does report pain in her back and anxiety. Patient received PRN medications for these and on reassessment they provided relief. Support and encouragement provided to the patient to attend groups and fill out daily inventory sheets. Patient able to voice her questions and concerns. Scheduled medications administered per physician's orders. Patient is receptive and cooperative. Patient is seen in the milieu and is attending groups. Patient is seen conversing with peers as well. Q15 minute checks are maintained for safety.

## 2014-09-01 ENCOUNTER — Encounter (HOSPITAL_COMMUNITY): Payer: Self-pay | Admitting: Registered Nurse

## 2014-09-01 DIAGNOSIS — R45851 Suicidal ideations: Secondary | ICD-10-CM

## 2014-09-01 DIAGNOSIS — F419 Anxiety disorder, unspecified: Secondary | ICD-10-CM

## 2014-09-01 DIAGNOSIS — F322 Major depressive disorder, single episode, severe without psychotic features: Secondary | ICD-10-CM

## 2014-09-01 MED ORDER — FLUOXETINE HCL 20 MG PO CAPS
20.0000 mg | ORAL_CAPSULE | Freq: Every day | ORAL | Status: DC
Start: 2014-09-01 — End: 2014-09-06
  Administered 2014-09-01 – 2014-09-06 (×6): 20 mg via ORAL
  Filled 2014-09-01 (×7): qty 1
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
## 2014-09-01 NOTE — Progress Notes (Signed)
D: Pt presents with flat affect and depressed mood. Pt stated that her depression is about the same and rates depression 5/10. Pt reported decreasing anxiety but continues to ask for klonopin. Pt reported that she was able to sleep well last night. Pt stated that she is thinking positive and trying to attend all groups.  A: Medications administered as ordered per MD. Verbal support given.  Pt encouraged to attend groups. 15 minute checks performed for safety.  R: Pt stated goal is to attend groups. Pt receptive to treatment.

## 2014-09-01 NOTE — Tx Team (Signed)
Interdisciplinary Treatment Plan Update   Date Reviewed:  09/01/2014  Time Reviewed:  10:30 AM  Progress in Treatment:   Attending groups: Yes Participating in groups: Yes Taking medication as prescribed: Yes  Tolerating medication: Yes Family/Significant other contact made:  Yes, collateral contact with sister Patient understands diagnosis: Yes  Discussing patient identified problems/goals with staff: Yes Medical problems stabilized or resolved: Yes Denies suicidal/homicidal ideation: Yes Patient has not harmed self or others: Yes  For review of initial/current patient goals, please see plan of care.  Estimated Length of Stay:  3-4 days  Reasons for Continued Hospitalization:  Anxiety Depression Medication stabilization  New Problems/Goals identified:    Discharge Plan or Barriers:   [facility]  Additional Comments:  Continue medication stabilization  Patient and CSW reviewed patient's identified goals and treatment plan.  Patient verbalized understanding and agreed to treatment plan.   Attendees:  Patient:  09/01/2014 10:30 AM   Signature:  Sallyanne HaversF. Cobos, MD 09/01/2014 10:30 AM  Signature: 09/01/2014 10:30 AM  Signature:  Earl ManySara Twyman, RN 09/01/2014 10:30 AM  Signature:  Liborio NixonPatrice White,  RN 09/01/2014 10:30 AM  Signature:  Rolla PlateAngela Troxler, RN 09/01/2014 10:30 AM  Signature:  Juline PatchQuylle Anaston Koehn, LCSW 09/01/2014 10:30 AM   09/01/2014 10:30 AM   09/01/2014 10:30 AM   09/01/2014 10:30 AM   09/01/2014  10:30 AM   09/01/2014  10:30 AM   09/01/2014  10:30 AM    Scribe for Treatment Team:   Juline PatchQuylle Aasiya Creasey,  09/01/2014 10:30 AM

## 2014-09-01 NOTE — Progress Notes (Signed)
Patient ID: Christy Walker, female   DOB: [birth year], 44 y.o.   MRN: [id number] PER STATE REGULATIONS 482.30  THIS CHART WAS REVIEWED FOR MEDICAL NECESSITY WITH RESPECT TO THE PATIENT'S ADMISSION/ DURATION OF STAY.  NEXT REVIEW DATE:09/06/2014   Willa RoughJENNIFER JONES [name], RN, BSN CASE MANAGER

## 2014-09-01 NOTE — Progress Notes (Signed)
D:Patient in the hallway on approach.  Patient states she had a good day other than pain.  Patient states she deals with pain everyday and anxiety.  Patient states she has been getting better.  Patient denies SI/HI and denies AVH. A: Staff to monitor Q 15 mins for safety.  Encouragement and support offered.  Scheduled medications administered per orders. Klonopin administered prn for anxiety.  Percocet administered prn for pain.   R: Patient remains safe on the unit.  Patient attended group tonight.  Patient visible on the unit and interacting with peers.  Patient taking administered medications.

## 2014-09-01 NOTE — [facility] Group Notes (Signed)
[facility]   09/01/2014 10:32 AM    Participation Quality:  Appropraite  Mood/Affect:  Appropriate  Depression Rating:  5  Anxiety Rating:  5  Thoughts of Suicide:  No  Will you contract for safety?   NA  Current AVH:  No  Plan for Discharge/Comments:  Patient attended discharge planning group and actively participated in group. Suicide prevention education reviewed and SPE document provided.   Transportation Means: Patient has transportation.   Supports:  Patient has a support system.   Christy Walker, Christy Walker 09/01/2014 10:32 AM

## 2014-09-01 NOTE — Progress Notes (Signed)
D: Pt has depressed affect and depressed, anxious mood.  Pt reports she has "been trying to go to all the meetings."  Pt reports she attended every group today.  Pt denies HI, denies hallucinations.  Pt reports passive SI without a plan and verbally contracts for safety.  Pt interacts with peers and staff appropriately.   A: Medications administered per order.  Safety maintained.  Met with pt 1:1 and provided support and encouragement.  PRN medications administered for pain, sleep, anxiety, see flowsheet.  Heat pack also provided for pain.   R: Pt is compliant with medications.  She verbally contracts for safety and reports she will notify staff of needs and concerns.  Will continue to monitor and assess for safety.

## 2014-09-01 NOTE — Plan of Care (Signed)
Problem: Diagnosis: Increased Risk For Suicide Attempt Goal: STG-Patient Will Attend All Groups On The Unit Outcome: Progressing Pt attended evening group on 08/31/14.  She reports she attended all other groups during the day on 08/31/14.

## 2014-09-01 NOTE — Progress Notes (Signed)
[facility] Group Notes:  (Nursing/MHT/Case Management/Adjunct)  Date:  09/01/2014  Time:  11:12 PM  Type of Therapy:  Psychoeducational Skills  Participation Level:  Minimal  Participation Quality:  Attentive  Affect:  Appropriate  Cognitive:  Appropriate  Insight:  Limited  Engagement in Group:  Resistant  Modes of Intervention:  Education  Summary of Progress/Problems: The patient expressed in group that she had a good day overall, but would not explain any further. She was also unable to share a coping skill with the group (theme for the day).   [name], Cindy Brindisi S 09/01/2014, 11:12 PM

## 2014-09-02 MED ORDER — TRAZODONE HCL 150 MG PO TABS
150.0000 mg | ORAL_TABLET | Freq: Every evening | ORAL | Status: DC | PRN
Start: 2014-09-02 — End: 2014-09-02

## 2014-09-02 MED ORDER — DOXEPIN HCL 25 MG PO CAPS
25.0000 mg | ORAL_CAPSULE | Freq: Every evening | ORAL | Status: DC | PRN
  Administered 2014-09-02 – 2014-09-03 (×2): 25 mg via ORAL
  Filled 2014-09-02 (×2): qty 1

## 2014-09-02 NOTE — Progress Notes (Signed)
D:  Patient's self inventory sheet, patient has poor sleep, sleep medication is not helpful.  Fair appetite, low energy level, good concentration.  Rated depression and hopeless 5, anxiety 7.  Denied withdrawals.  Continues to be irritable.  Denied SI.  Physical problems of pain, headaches.  Worst pain #8, back.  Pain medication is helpful.  Goal is to be positive.  Plans to go to meetings.  No discharge plans.  No problems anticipated after discharge. A:  Medications administered per MD orders.  Emotional support and encouragement given patient. R:  Denied SI and HI, contracts for safety.  Denied A/V hallucinations.  Safety maintained with 15 minute checks.

## 2014-09-02 NOTE — [facility] Group Notes (Signed)
[facility] LCSW Group Therapy Note  09/02/2014 / 11:15 AM  Type of Therapy and Topic:  Group Therapy: Avoiding Self-Sabotaging and Enabling Behaviors  Participation Level:  Did Not Attend. Pt choose not to attend group after she was informed of group start time and location.     Carney Bernatherine C Shavawn Stobaugh, LCSW

## 2014-09-02 NOTE — [facility] Group Notes (Signed)
The focus of this group is to educate the patient on the purpose and policies of crisis stabilization and provide a format to answer questions about their admission.  The group details unit policies and expectations of patients while admitted.  Patient did not attend 0900 nurse education orientation group this morning.  Patient stayed in bed.   

## 2014-09-02 NOTE — Plan of Care (Signed)
Problem: Consults Goal: Depression Patient Education See Patient Education Module for education specifics.  Outcome: Completed/Met Date Met:  09/02/14 Nurse discussed depression with pt.     
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
## 2014-09-03 DIAGNOSIS — M545 Low back pain, unspecified: Secondary | ICD-10-CM | POA: Insufficient documentation

## 2014-09-03 MED ORDER — LIDOCAINE 5 % EX PTCH
1.0000 | MEDICATED_PATCH | Freq: Every day | CUTANEOUS | Status: DC
  Administered 2014-09-03 – 2014-09-10 (×8): 1 via TRANSDERMAL
  Filled 2014-09-03: qty 14
  Filled 2014-09-03 (×9): qty 1
  Filled 2014-09-03: qty 14
  Filled 2014-09-03: qty 1

## 2014-09-03 NOTE — Plan of Care (Signed)
Problem: Alteration in mood Goal: STG-Patient is able to discuss feelings and issues (Patient is able to discuss feelings and issues leading to depression)  Outcome: Progressing Active participation in groups today.
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
## 2014-09-03 NOTE — Progress Notes (Signed)
D: Pt has anxious, depressed affect and mood.  Pt reports her day was "fine."  Pt reports she did not have a goal for the day and made a goal for the night to "have a good night."  Pt denies SI/HI, denies hallucinations.  Pt attended evening group and interacted appropriately with staff and peers.   A: Medications administered per order.  PRN medication administered for sleep and anxiety, see flowsheet.  Heat pack provided for pain.  Safety maintained.  Supported and encouraged pt.   R: Pt is compliant with medications.  She verbally contracts for safety.  Pt is resting in room with eyes closed.  Respirations are even and unlabored.  No distress noted.  Will continue to monitor and assess for safety.

## 2014-09-03 NOTE — Plan of Care (Signed)
Problem: Alteration in mood; excessive anxiety as evidenced by: Goal: STG-Pt will report an absence of self-harm thoughts/actions (Patient will report an absence of self-harm thoughts or actions)  Outcome: Progressing Pt denied SI and thoughts of self-harm.  She has not harmed herself this shift.  She verbally contracted for safety.

## 2014-09-03 NOTE — [facility] Group Notes (Signed)
[facility]  09/03/2014 1:15 PM   Type of Therapy:  Group Therapy  Participation Level:  Did Not Attend  Reyes IvanChelsea Horton, LCSW 09/03/2014 3:16 PM

## 2014-09-03 NOTE — Progress Notes (Signed)
[facility] Group Notes:  (Nursing/MHT/Case Management/Adjunct)  Date:  09/03/2014  Time:  12:15 AM  Type of Therapy:  Psychoeducational Skills  Participation Level:  Minimal  Participation Quality:  Attentive  Affect:  Appropriate  Cognitive:  Lacking  Insight:  Lacking  Engagement in Group:  Lacking  Modes of Intervention:  Education  Summary of Progress/Problems: The patient shared with the group that she spent her day socializing with her peers. No additional details about her day were provided. As a theme for the day, she indicated that her support system will be comprised of her sister.   Tharon Kitch S 09/03/2014, 12:15 AM

## 2014-09-03 NOTE — Progress Notes (Signed)
Patient ID: Christy Walker, female   DOB: 1970/09/03, 44 y.o.   MRN: [id number]  Pt currently presents with a flat affect and depressed behavior. Pt has been an active part in the milieu. Pt frequently asks for medications throughout the day. Pt provided with medications per providers orders. Pt's labs and vitals were monitored throughout the day. Pt supported emotionally and encouraged to express concerns and questions. Pt educated on medications and stress management. Pt's safety ensured with 15 minute and environmental checks. Pt currently denies SI/HI and A/V hallucinations. Pt verbally agrees to seek staff if SI/HI or A/VH occurs and to consult with staff before acting on these thoughts.

## 2014-09-04 MED ORDER — PRAZOSIN HCL 1 MG PO CAPS
1.0000 mg | ORAL_CAPSULE | Freq: Every day | ORAL | Status: DC
  Administered 2014-09-04 – 2014-09-06 (×3): 1 mg via ORAL
  Filled 2014-09-04 (×5): qty 1

## 2014-09-04 MED ORDER — MIRTAZAPINE 15 MG PO TABS
15.0000 mg | ORAL_TABLET | Freq: Every day | ORAL | Status: DC
  Administered 2014-09-04 – 2014-09-09 (×6): 15 mg via ORAL
  Filled 2014-09-04: qty 1
  Filled 2014-09-04 (×2): qty 14
  Filled 2014-09-04 (×6): qty 1

## 2014-09-04 NOTE — Progress Notes (Signed)
D:  Patient's self inventory sheet, patient has poor sleep, sleep medication is not helpful.  Fair appetite, low energy level, good concentration.  Rated depression and hopeless 4, anxiety 7.  Denied withdrawals.  Denied SI.  Physical problems of back and headache.  Worst pain 10, pain medication is helpful.  Goal today is to be positive.  Plans to go to groups and open up, talk to people.  Does have discharge plans.  No problems anticipated after discharge.  Has medicare for medications, hopes this will cover her expensive medications. A:  Medications administered per MD orders.  Emotional support and encouragement given patient. R:  Denied SI and HI.  Denied A/V hallucinations.  Safety maintained with 15 minute  Checks.

## 2014-09-04 NOTE — [facility] Group Notes (Signed)
[facility]   09/04/2014 9:57 AM    Participation Quality:  Appropraite  Mood/Affect:  Appropriate  Depression Rating:  4  Anxiety Rating:  5  Thoughts of Suicide:  No  Will you contract for safety?   NA  Current AVH:  No  Plan for Discharge/Comments:  Patient attended discharge planning group and actively participated in group. She reports feeling a little better but uncertain of being ready to discharge.  [facility]. Suicide prevention education reviewed and SPE document provided.   Transportation Means: Patient has transportation.   Supports:  Patient has a support system.   Danira Nylander, [name]

## 2014-09-04 NOTE — Progress Notes (Addendum)
D: Pt has anxious affect and mood.  Pt reports her goal was to attend all groups and that she accomplished her goal.  Pt denies SI/HI, denies hallucinations.  Pt is intrusive when interacting with peers, especially regarding medication regimens.  At one point in the shift, pt approached staff and was tearful, reporting that she received bad news via phone call.   A: Pt was encouraged to remain focused on her treatment.  Supported pt.  PRN medication administered for anxiety and sleep, see flowsheet.  Safety maintained.   R: Pt verbally contracts for safety.  She is compliant with medications and is very knowledgeable about the medications she can get as needed and when she can get them.  Will continue to monitor and assess for safety.

## 2014-09-04 NOTE — Plan of Care (Signed)
Problem: Consults Goal: Suicide Risk Patient Education (See Patient Education module for education specifics)  Outcome: Completed/Met Date Met:  09/04/14 Nurse discussed suicide information with patient.     

## 2014-09-04 NOTE — Plan of Care (Signed)
Problem: Alteration in mood Goal: LTG-Patient reports reduction in suicidal thoughts (Patient reports reduction in suicidal thoughts and is able to verbalize a safety plan for whenever patient is feeling suicidal)  Outcome: Progressing Pt denies SI this shift.  She verbally contracts for safety.       

## 2014-09-04 NOTE — [facility] Group Notes (Signed)
[facility] LCSW Group Therapy          Overcoming Obstacles       1:15 -2:30        09/04/2014       Type of Therapy:  Group Therapy  Participation Level:  Appropriate  Participation Quality:  Appropriate  Affect: Depressed, Tearful  Cognitive:  Appropriate  Insight: Developing/Improving   Engagement in Therapy: Developing/Improving  Modes of Intervention:  Discussion Exploration  Education Rapport BuildingProblem-Solving Support  Summary of Progress/Problems:  The main focus of today's group was overcoming obstacles. Patient shared the obstacle she needs to overcome is guilt.  Patient became tearful and unable to share in the discussion before being called out to meet with MD. Patient able to identify appropriate coping skills.   [name], Christy Walker 09/04/2014
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
## 2014-09-05 NOTE — Progress Notes (Signed)
[facility] Group Notes:  (Nursing/MHT/Case Management/Adjunct)  Date:  09/05/2014  Time:  12:28 AM  Type of Therapy:  Psychoeducational Skills  Participation Level:  Minimal  Participation Quality:  Attentive  Affect:  Flat  Cognitive:  Lacking  Insight:  Lacking  Engagement in Group:  Resistant  Modes of Intervention:  Education  Summary of Progress/Problems: The patient did not share with the group anything regarding her day. As a theme for the day, her steps to wellness will include attending counseling after discharge and trying to focus on herself.   Brenn Deziel S 09/05/2014, 12:28 AM
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
## 2014-09-05 NOTE — Plan of Care (Signed)
Problem: Consults Goal: Anxiety Disorder Patient Education See Patient Education Module for eduction specifics.  Outcome: Completed/Met Date Met:  09/05/14 Nurse discussed anxiety with patient.

## 2014-09-05 NOTE — [facility] Group Notes (Signed)
The focus of this group is to educate the patient on the purpose and policies of crisis stabilization and provide a format to answer questions about their admission.  The group details unit policies and expectations of patients while admitted.  Patient did not attend 0900 nurse education orientation group this morning.  Patient stayed in her room. 

## 2014-09-05 NOTE — [facility] Group Notes (Signed)
[facility] LCSW Group Therapy  Feelings Around Diagnosis 1:15 - 2: 30 PM        09/05/2014     Type of Therapy:  Group Therapy  Participation Level:  Appropriate  Participation Quality:  Appropriate  Affect:  Appropriate  Cognitive:  Attentive Appropriate  Insight:  Developing/Improving Engaged  Engagement in Therapy:  Developing/Improving Engaged  Modes of Intervention:  Discussion Exploration Problem-Solving Supportive  Summary of Progress/Problems:  Group topic was feelings around diagnosis.  She shared she is okay with having a mental health diagnosis of depression.  Patient shared several of her family members have depression and have been able to manage their lives well.  .  Patient was able to identify approprite coping skills.  [name], Nikos Anglemyer Hairston 09/05/2014

## 2014-09-05 NOTE — Progress Notes (Addendum)
D:  Patient denied SI and HI, contracts for safety this morning while talking to nurse.  Denied A/V hallucinations.  Patient very sleepy this morning, did not want to get out of bed for morning medications.  When patient did come to med window, first thing patient stated was she wanted her medications for anxiety and pain.  Nurse administered scheduled medications and discussed prn oxycodone and klonipin with MD before administering prn's with MD approval.  Patient has been to afternoon groups.  Went to dining room for lunch. A:  Medications administered per MD orders.  Emotional support and encouragement given patient. R:  Denied SI and HI, contracts for safety.  Denied A/V hallucinations.  Safety maintained with 15 minute checks.  Patient's self inventory form, patient slept fair last night, sleep medication is helpful.  Good appetite, normal energy level, good concentration.  Rated depression 3, hopeless 2, anxiety 5.  Denied withdrawals.  Denied SI.  Physical problems of back, headache, worst pain #8.  Pain medication is helpful.  Goal is to be positive.  Plans to go to meetings.  Having bad muscle spasms.  Does have discharge plans.  No problems anticipated after discharge.

## 2014-09-05 NOTE — Tx Team (Signed)
Interdisciplinary Treatment Plan Update   Date Reviewed:  09/05/2014  Time Reviewed:  8:51 AM  Progress in Treatment:   Attending groups: Yes, patient is attending groups. Participating in groups: Yes, patient engages in discussion. Taking medication as prescribed: Yes  Tolerating medication: Yes Family/Significant other contact made:  Yes, collateral contact with sister Patient understands diagnosis: Yes, patient understands diagnosis and need for treatment. Discussing patient identified problems/goals with staff: Yes, patient is able to express goals for treatment and discharge. Medical problems stabilized or resolved: Yes Denies suicidal/homicidal ideation: Yes Patient has not harmed self or others: Yes  For review of initial/current patient goals, please see plan of care.  Estimated Length of Stay:  1-2 days  Reasons for Continued Hospitalization:  Anxiety Depression Medication stabilization  New Problems/Goals identified:    Discharge Plan or Barriers:   [facility]  Additional Comments:  Continue medication stabilization  Patient and CSW reviewed patient's identified goals and treatment plan.  Patient verbalized understanding and agreed to treatment plan.   Attendees:  Patient:  09/05/2014 8:51 AM   Signature:  Sallyanne HaversF. Cobos, MD 09/05/2014 8:51 AM  Signature:  Geoffery LyonsIrving Lugo, MD 09/05/2014 8:51 AM  Signature:  Quintella ReichertBeverly Knight,  RN 09/05/2014 8:51 AM  Signature:  Robbie LouisVivian Kent,  RN 09/05/2014 8:51 AM  Signature:   09/05/2014 8:51 AM  Signature:  Juline PatchQuylle Ethridge Sollenberger, LCSW 09/05/2014 8:51 AM  Signature:  Belenda CruiseKristin Drinkard, LCSW-A 09/05/2014 8:51 AM  Signature:  Trula SladeHeather Smart, LCSW-A 09/05/2014 8:51 AM  Signature:  Leisa LenzValerie Enoch, Arlana HoveMonach Transition Team 09/05/2014 8:51 AM  Signature:  Onnie BoerJennifer Clark, RN, UR CM 09/05/2014  8:51 AM   09/05/2014  8:51 AM   09/05/2014  8:51 AM    Scribe for Treatment Team:   Juline PatchQuylle Tria Noguera,  09/05/2014 8:51 AM

## 2014-09-05 NOTE — Progress Notes (Signed)
Recreation Therapy Notes  Animal-Assisted Activity/Therapy (AAA/T) Program Checklist/Progress Notes Patient Eligibility Criteria Checklist & Daily Group note for Rec Tx Intervention  Date: 01.26.2016 Time: 2:45pm [address]   AAA/T Program Assumption of Risk Form signed by Patient/ or Parent Legal Guardian yes  Patient is free of allergies or sever asthma yes  Patient reports no fear of animals yes  Patient reports no history of cruelty to animals yes  Patient understands his/her participation is voluntary yes  Patient washes hands before animal contact yes  Patient washes hands after animal contact yes  Behavioral Response: Appropriate, Engaged  Education: Charity fundraiserHand Washing, Appropriate Animal Interaction   Education Outcome: Acknowledges education.   Clinical Observations/Feedback: Patient actively engaged in session, petting therapy dog appropriately.   Marykay Lexenise L Leotta Weingarten, LRT/CTRS  Jearl KlinefelterBlanchfield, Aydeen Blume L 09/05/2014 4:46 PM

## 2014-09-05 NOTE — Progress Notes (Signed)
Patient ID: Christy Walker, female   DOB: 1971-07-29, 44 y.o.   MRN: [id number]   D: After the introduction writer asked the pt about her hospital stay to this point. Pt stated, "I don't want to leave just to come back like (naming one of her peers that was discharged yesterday and returned today)". Pt stated I don't want to leave before I'm ready. Pt informed the writer that the 29th is the anniversary of her mom's death. Stated she doesn't want her 617 yr old daughter to see her one day and then she be back at [facility] the next.  A:  Support and encouragement was offered. 15 min checks continued for safety.  R: Pt remains safe.
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
## 2014-09-06 MED ORDER — FLUOXETINE HCL 20 MG PO CAPS
40.0000 mg | ORAL_CAPSULE | Freq: Every day | ORAL | Status: DC
Start: 2014-09-07 — End: 2014-09-10
  Administered 2014-09-07 – 2014-09-10 (×4): 40 mg via ORAL
  Filled 2014-09-06 (×3): qty 2
  Filled 2014-09-06: qty 28
  Filled 2014-09-06: qty 2
  Filled 2014-09-06: qty 28
  Filled 2014-09-06: qty 2

## 2014-09-06 NOTE — Progress Notes (Signed)
Patient ID: Christy Walker, female   DOB: [name], 44 y.o.   MRN: [id number]  DAR: Pt. Denies SI/HI and A/V Hallucinations. Patient reports pain in lower back and received medication for this which patient reported relief. Patient rates her depression at 5/10, hopelessness at 4/10, and her anxiety at 6/10. Patient reports sleeping fair, appetite as being fair, energy level is low, and concentration level is low. Support and encouragement provided to the patient. Scheduled medications administered to patient per physician's orders. Patient received PRN anti-anxiety medication which provided relief. Patient is receptive and cooperative with Clinical research associatewriter. Patient and writer spoke 1:1 about her mother and patient became tearful. Patient reports she is going to grief counseling after discharge and is doing it for herself. Patient is seen in the milieu and is attending groups. Q15 minute checks are maintained for safety.
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
## 2014-09-06 NOTE — [facility] Group Notes (Signed)
[facility]   09/06/2014 11:28 AM    Participation Quality:  Did not attend group.  [name], Laquanta Hummel Hairston  09/06/2014 11:28 AM
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
## 2014-09-06 NOTE — Progress Notes (Signed)
Adult Psychoeducational Group Note  Date:  09/06/2014 Time:  10:09 PM  Group Topic/Focus:  Narcotics Anonymous  Participation Level:  Active  Additional Comments:  Pt attended and participated in group.  Berlin Hunuttle, Amy Gothard M 09/06/2014, 10:09 PM

## 2014-09-06 NOTE — [facility] Group Notes (Signed)
.  [facility] LCSW Group Therapy  Emotional Regulation 1:15 - 2: 30 PM        09/06/2014     Type of Therapy:  Group Therapy  Participation Level:  Appropriate  Participation Quality:  Minimal  Affect:  Tearful, Depressed  Cognitive:  Appropriate  Insight:  Developing/Improving   Engagement in Therapy:  Developing/Improving  Modes of Intervention:  Discussion Exploration Problem-Solving Supportive  Summary of Progress/Problems:  Group topic was emotional regulations.  Patient was too emotional to participate in the discussion other than to state she has a lot of guilt. [name], Christy Walker 09/06/2014

## 2014-09-06 NOTE — Progress Notes (Signed)
D:  Christy Walker continues to struggle with the upcoming anniversary of her mother's death.  She is tearful at times but reports that she is working through it as much as she can.  She denies any thoughts of hurting herself or others.  She is interacting in the dayroom with other patients and is attending groups. A:  Safety checks q 15 minutes.  Emotional support provided.  Medications administered as ordered.\ R:  Safety maintained upon unit.

## 2014-09-07 MED ORDER — METHOCARBAMOL 500 MG PO TABS
500.0000 mg | ORAL_TABLET | Freq: Once | ORAL | Status: AC
  Administered 2014-09-07: 500 mg via ORAL
  Filled 2014-09-07 (×2): qty 1

## 2014-09-07 MED ORDER — PRAZOSIN HCL 2 MG PO CAPS
2.0000 mg | ORAL_CAPSULE | Freq: Every day | ORAL | Status: DC
  Administered 2014-09-07 – 2014-09-09 (×3): 2 mg via ORAL
  Filled 2014-09-07: qty 1
  Filled 2014-09-07: qty 14
  Filled 2014-09-07 (×2): qty 1
  Filled 2014-09-07: qty 14
  Filled 2014-09-07: qty 1

## 2014-09-07 MED ORDER — METHOCARBAMOL 500 MG PO TABS
500.0000 mg | ORAL_TABLET | Freq: Three times a day (TID) | ORAL | Status: DC | PRN
  Administered 2014-09-07 – 2014-09-10 (×6): 500 mg via ORAL
  Filled 2014-09-07 (×6): qty 1
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
## 2014-09-07 NOTE — [facility] Group Notes (Signed)
[facility] 0900 Nursing Group  The focus of this group is to educate the patient on the purpose and policies of crisis stabilization and provide a format to answer questions about their admission.  The group details unit policies and expectations of patients while admitted.  Patient attended group and was engaged. Patient received a journal to write down her thoughts and questions for the MD. Patient had no questions or concerns.

## 2014-09-07 NOTE — Progress Notes (Signed)
[facility] Group Notes:  (Nursing/MHT/Case Management/Adjunct)  Date:  09/07/2014  Time:2030 Type of Therapy:  wrap up group  Participation Level:  Active  Participation Quality:  Appropriate, Sharing and Supportive  Affect:  Appropriate  Cognitive:  Alert  Insight:  Improving  Engagement in Group:  Engaged  Modes of Intervention:  Clarification, Education and Support  Summary of Progress/Problems: Pt said she was able to speak to her 65seven year old daughter today. Pt also plans on discharging home and beginning counseling.   Shelah LewandowskySquires, Ophelia Sipe Carol 09/07/2014, 9:49 PM
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
## 2014-09-07 NOTE — [facility] Group Notes (Signed)
[facility] LCSW Group Therapy  [employer]  1:15 - 2:30 PM  09/07/2014 3:08 PM   Type of Therapy:  Group Therapy  Participation Level: Active  Participation Quality:  Attentive  Affect:  Appropriate  Cognitive:  Appropriate  Insight:  Developing/Improving   Engagement in Therapy:  Developing/Improving   Modes of Intervention:  Discussion, Education, Exploration, Problem-Solving, Rapport Building, Support   Summary of Progress/Problems:   Patient was attentive to speaker from the [employer] as he shared his story of dealing with mental health/substance abuse issues and overcoming it by working a recovery program.  Patient expressed interest in their programs and services and received information on their agency. Patient stated she plans to attend programming and feels she could really benefit from the anger management class.   [name], Christy Walker 09/07/2014 3:08 PM

## 2014-09-08 MED ORDER — DICLOFENAC SODIUM 1 % TD GEL
2.0000 g | Freq: Two times a day (BID) | TRANSDERMAL | Status: DC | PRN
  Administered 2014-09-08 (×2): 2 g via TOPICAL
  Filled 2014-09-08: qty 100

## 2014-09-08 NOTE — Progress Notes (Signed)
  [facility] :  Will you be returning to the same living situation after discharge:  Yes,  Patient discharging home with family. At discharge, do you have transportation home?: Yes,  Patient to arrange transportation home. Do you have the ability to pay for your medications: No.  Patient has Medicare but does not have money at this time to purchase medications.   Patient needs assistance with indigent medications   Release of information consent forms completed and in the chart;  Patient's signature needed at discharge.  Patient to Follow up at: Follow-up Information    [facility] On [facility].   Why:  You are scheduled with Tomma LightningFrankie on Wednesday, September 20, 2014 at 11AM.  Please bring completed registration packet to the appointment.   Contact information:   [address] [place], [place]   [address]  [phone]      [facility]Agarwal [facility] On 10/05/2014.   Why:  Thursday, October 05, 2014 at [facility]PM.  Please arrive at 2:45 PM   Contact information:   [address] [place], [place]   [address]  [phone]      Patient denies SI/HI: Patient no longer endorsing SI/HI or other thoughts of self harm.   Safety Planning and Suicide Prevention discussed:   .Reviewed with all patients during discharge planning group  Has patient been referred to the Quitline?: Patient referred to Quitline on 08/12/14. [name], Thomasena Vandenheuvel Hairston 09/08/2014, 2:58 PM

## 2014-09-08 NOTE — Progress Notes (Signed)
D:Pt rates her anxiety as a 10 and depression as a 6 on 1-10 scale with 10 being the most. Pt has been staying out of her room today and reports that she wants to stay busy and not talk about her mother today. Today is the date of her mom's death.  A:Offered support, encouragement and 15 minute checks. R:Pt denies si and hi. Safety maintained on the unit.
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
## 2014-09-08 NOTE — Tx Team (Signed)
Interdisciplinary Treatment Plan Update   Date Reviewed:  09/08/2014  Time Reviewed:  8:49 AM  Progress in Treatment:   Attending groups: Yes, patient is attending groups. Participating in groups: Yes, patient engages in discussion. Taking medication as prescribed: Yes  Tolerating medication: Yes Family/Significant other contact made:  Yes, collateral contact with sister Patient understands diagnosis: Yes, patient understands diagnosis and need for treatment. Discussing patient identified problems/goals with staff: Yes, patient is able to express goals for treatment and discharge. Medical problems stabilized or resolved: Yes Denies suicidal/homicidal ideation: Yes Patient has not harmed self or others: Yes  For review of initial/current patient goals, please see plan of care.  Estimated Length of Stay:  1-2 days  Reasons for Continued Hospitalization:  Anxiety Depression Medication stabilization  New Problems/Goals identified:    Discharge Plan or Barriers:   [facility]  Additional Comments:  Continue medication stabilization  Patient and CSW reviewed patient's identified goals and treatment plan.  Patient verbalized understanding and agreed to treatment plan.   Attendees:  Patient:  09/08/2014 8:49 AM   Signature:  Sallyanne HaversF. Cobos, MD 09/08/2014 8:49 AM  Signature:  Geoffery LyonsIrving Lugo, MD 09/08/2014 8:49 AM  Signature: Waynetta SandyJan Wright,  RN 09/08/2014 8:49 AM  Signature:  Robbie LouisVivian Kent,  RN 09/08/2014 8:49 AM  Signature:  Rodman KeyJanet Webb, RN 09/08/2014 8:49 AM  Signature:  Juline PatchQuylle Avagrace Botelho, LCSW 09/08/2014 8:49 AM  Signature:  Belenda CruiseKristin Drinkard, LCSW-A 09/08/2014 8:49 AM  Signature:  Trula SladeHeather Smart, LCSW-A 09/08/2014 8:49 AM  Signature:  Leisa LenzValerie Enoch, Arlana HoveMonach Transition Team 09/08/2014 8:49 AM  Signature:  Onnie BoerJennifer Clark, RN, UR CM 09/08/2014  8:49 AM   09/08/2014  8:49 AM   09/08/2014  8:49 AM    Scribe for Treatment Team:   Juline PatchQuylle Payeton Germani,  09/08/2014 8:49 AM

## 2014-09-08 NOTE — Progress Notes (Signed)
D   Pt is pleasant on approach and cooperative   She expressed concern over missing her daughter who is staying with her sister   She is tearful at times and anxious A   Verbal support given   Medications administered and effectiveness monitored   Q 15 min checks R   Pt safe at present

## 2014-09-08 NOTE — Plan of Care (Signed)
Problem: Alteration in mood Goal: LTG-Patient reports reduction in suicidal thoughts (Patient reports reduction in suicidal thoughts and is able to verbalize a safety plan for whenever patient is feeling suicidal)  Outcome: Progressing Pt denies si thoughts.       

## 2014-09-08 NOTE — [facility] Group Notes (Signed)
[facility]   09/08/2014 11:22 AM    Participation Quality:  Appropraite  Mood/Affect:  Appropriate  Depression Rating:  7  Anxiety Rating:  10  Thoughts of Suicide:  No  Will you contract for safety?   NA  Current AVH:  No  Plan for Discharge/Comments:  Patient attended discharge planning group and actively participated in group. Patient is hopeful to discharge this weekend.  [facility]. Suicide prevention education reviewed and SPE document provided.   Transportation Means: Patient has transportation.   Supports:  Patient has a support system.   Lichelle Viets, [name]

## 2014-09-08 NOTE — Progress Notes (Signed)
Spiritual care follow up around grief / anniversary of mother's death.

## 2014-09-08 NOTE — Plan of Care (Signed)
Problem: Ineffective individual coping Goal: LTG: Patient will report a decrease in negative feelings Outcome: Progressing Pt making more positive statements Goal: STG: Patient will remain free from self harm Pt is free from self harm

## 2014-09-09 DIAGNOSIS — F4323 Adjustment disorder with mixed anxiety and depressed mood: Secondary | ICD-10-CM

## 2014-09-09 MED ORDER — CLONAZEPAM 1 MG PO TABS
1.0000 mg | ORAL_TABLET | Freq: Three times a day (TID) | ORAL | Status: DC | PRN
  Administered 2014-09-09 – 2014-09-10 (×2): 1 mg via ORAL
  Filled 2014-09-09 (×2): qty 1

## 2014-09-09 MED ORDER — DICLOFENAC SODIUM 1 % TD GEL
2.0000 g | Freq: Three times a day (TID) | TRANSDERMAL | Status: DC | PRN
  Administered 2014-09-09: 2 g via TOPICAL
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
## 2014-09-09 NOTE — Progress Notes (Signed)
Pt biting finger nails, c/o increase anxiety, see MAR. Will continue to monitor closely.

## 2014-09-09 NOTE — [facility] Group Notes (Signed)
[facility] Group Notes:  (Nursing/MHT/Case Management/Adjunct)  Date:  09/09/2014  Time:  9:30  Type of Therapy:  Nurse Education - Goal setting/Coping skills  Participation Level:  Active  Participation Quality:  Inattentive - patient frequently conversing with peer  Affect:  Appropriate  Cognitive:  Appropriate  Insight:  Improving  Engagement in Group:  Distracting - see above  Modes of Intervention:  Discussion, Education and Support  Summary of Progress/Problems:  Discussed how to go about goal setting via the SMART method. Also discussed the theme for today, coping skills and how important they are in conjunction with medication management. Patient was attentive but frequently engaged in side conversation with peer.   Lawrence MarseillesFriedman, Asaiah Scarber Eakes 09/09/2014, 5:59 PM

## 2014-09-09 NOTE — Progress Notes (Signed)
D   Pt is pleasant on approach and cooperative   She expressed concern over missing her daughter who is staying with her sister   She is tearful at times and anxious  She said she will be better tomorrow after the anniversary of her mothers death   She also said the visit from the chaplain helped her see some things she can do to help her get better A   Verbal support given   Medications administered and effectiveness monitored   Q 15 min checks  Encouraged venting and discussing her feelings R   Pt safe at present   Pt verbalized understanding

## 2014-09-09 NOTE — Progress Notes (Signed)
[facility] Group Notes:  (Nursing/MHT/Case Management/Adjunct)  Date:  09/09/2014  Time:  2030  Type of Therapy:  wrap up group  Participation Level:  Minimal  Participation Quality:  Inattentive  Affect:  Appropriate  Cognitive:  Alert  Insight:  Improving  Engagement in Group:  Distracting and Limited  Modes of Intervention:  Clarification, Education and Support  Summary of Progress/Problems: Pt only reported that it was good she woke this morning and that she came from home and is going home to her kids. Pt had continued side talk throughout the entire meeting.   Shelah LewandowskySquires, Aaniyah Strohm Carol 09/09/2014, 10:16 PM
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
## 2014-09-09 NOTE — Progress Notes (Signed)
Pt alert, pleasant and cooperative. Affect/mood anxious and labile. "I finally had a chance to deal with what I have been dealing with for 7years, I needed this". C/o anxiety, leg and back pain, see MAR. -SI/HI, verbally contracts for safety. -A/[place]. Visible in dayroom, interactive with peers. Emotional support and encouragement given. Will continue to monitor closely and evaluate for stabilization.

## 2014-09-09 NOTE — Progress Notes (Signed)
Patient up and visible in the milieu today. Interacting with peers and staff appropriately. Participating in programming. Continues to have frequent med requests for anxiety as well as chronic pain issues. She rates her depression at a 4/10, hopelessness of a 2/10 and anxiety at an 8/10. Pain tends to oscillate between 8/10 to 4/10. She states her goal for today is to "stay positive and happy and not to lay around in her room." Patient medicated per orders. Offered support and reassurance. She denies SI/HI/AVH and is looking for discharge tomorrow. Lawrence MarseillesFriedman, June Vacha Eakes

## 2014-09-10 MED ORDER — CLONAZEPAM 1 MG PO TABS: 1.0000 mg | ORAL_TABLET | Freq: Three times a day (TID) | ORAL | Status: DC | PRN

## 2014-09-10 MED ORDER — FLUOXETINE HCL 40 MG PO CAPS: 40.0000 mg | ORAL_CAPSULE | Freq: Every day | ORAL | Status: DC

## 2014-09-10 MED ORDER — MIRTAZAPINE 15 MG PO TABS: 15.0000 mg | ORAL_TABLET | Freq: Every day | ORAL | Status: DC

## 2014-09-10 MED ORDER — HYDROXYZINE HCL 25 MG PO TABS: 25.0000 mg | ORAL_TABLET | Freq: Three times a day (TID) | ORAL | Status: DC | PRN

## 2014-09-10 MED ORDER — OXYCODONE-ACETAMINOPHEN 5-325 MG PO TABS: 1.0000 | ORAL_TABLET | Freq: Two times a day (BID) | ORAL | Status: DC | PRN

## 2014-09-10 MED ORDER — NICOTINE 21 MG/24HR TD PT24: 21.0000 mg | MEDICATED_PATCH | Freq: Every day | TRANSDERMAL | Status: DC

## 2014-09-10 MED ORDER — LIDOCAINE 5 % EX PTCH: 1.0000 | MEDICATED_PATCH | Freq: Every day | CUTANEOUS | Status: DC

## 2014-09-10 MED ORDER — PRAZOSIN HCL 2 MG PO CAPS: 2.0000 mg | ORAL_CAPSULE | Freq: Every day | ORAL | Status: DC

## 2014-09-10 NOTE — [facility] Group Notes (Signed)
[facility] Group Notes:  (Clinical Social Work)  09/10/2014  10:00-11:00AM  Summary of Progress/Problems:   The main focus of today's process group was to   1)  discuss the importance of adding supports  2)  define health supports versus unhealthy supports  3)  identify the patient's current unhealthy supports and plan how to handle them  4)  Identify the patient's current healthy supports and plan what to add.  An emphasis was placed on using counselor, doctor, therapy groups, 12-step groups, and problem-specific support groups to expand supports.    The patient expressed full comprehension of the concepts presented, and agreed that there is a need to add more supports.  The patient stated her children aged 23yo, 44yo, 44yo and 7yo are supportive, although her 44yo can be an unhealthy support at times due to her current maturation level.  She feels her father is sometimes healthy and sometimes unhealthy in his support of her.  She left the group at about the halfway point.  Type of Therapy:  Process Group with Motivational Interviewing  Participation Level:  Active  Participation Quality:  Attentive  Affect:  Appropriate  Cognitive:  Appropriate  Insight:  Developing/Improving  Engagement in Therapy:  Engaged  Modes of Intervention:   Education, Support and Processing, Activity  Pilgrim's PrideMareida Grossman-Orr, LCSW 09/10/2014, 12:15pm

## 2014-09-10 NOTE — Progress Notes (Signed)
Patient ID: Christy Walker, female   DOB: 1971-07-27, 44 y.o.   MRN: [id number] Nursing discharge note:  Patient discharged home per MD order.  Patient received all personal belongings, prescriptions and medication samples.  Discharge instructions reviewed, along with follow up appts.  Medications were reviewed and patient indicated understanding.  She agreed to the oxycodone 5/324 (10 pills).  She denies SI/HI/AVH.  Patient left ambulatory with friends.

## 2014-09-10 NOTE — [facility] Suicide Risk Assessment (Signed)
[facility] Discharge Suicide Risk Assessment   Demographic Factors:  Caucasian  Total Time spent with patient: 30 minutes  Musculoskeletal: Strength & Muscle Tone: within normal limits Gait & Station: normal Patient leans: N/Walker  Psychiatric Specialty Exam: Physical Exam  Review of Systems  Constitutional: Negative.   HENT: Negative.   Eyes: Negative.   Respiratory: Negative.   Cardiovascular: Negative.   Gastrointestinal: Negative.   Genitourinary: Negative.   Musculoskeletal: Positive for back pain.  Skin: Negative.   Neurological: Negative.   Endo/Heme/Allergies: Negative.   Psychiatric/Behavioral: Positive for depression. The patient is nervous/anxious.     Blood pressure 107/83, pulse 86, temperature 97.7 F (36.5 C), temperature source Oral, resp. rate 16, height  (1.727 m), weight 78.019 kg (172 lb).Body mass index is 26.16 kg/(m^2).  General Appearance: Fairly Groomed  Patent attorney::  Fair  Speech:  Clear and Coherent409  Volume:  Normal  Mood:  sad  Affect:  Appropriate  Thought Process:  Coherent and Goal Directed  Orientation:  Full (Time, Place, and Person)  Thought Content:  plans as she moves on  Suicidal Thoughts:  No  Homicidal Thoughts:  No  Memory:  Immediate;   Fair Recent;   Fair Remote;   Fair  Judgement:  Fair  Insight:  Present  Psychomotor Activity:  Normal  Concentration:  Fair  Recall:  [employer] of Knowledge:Fair  Language: Fair  Akathisia:  No  Handed:  Right  AIMS (if indicated):     Assets:  Desire for Improvement Social Support  Sleep:  Number of Hours: 6.25  Cognition: WNL  ADL's:  Intact   Have you used any form of tobacco in the last 30 days? (Cigarettes, Smokeless Tobacco, Cigars, and/or Pipes): Yes  Has this patient used any form of tobacco in the last 30 days? (Cigarettes, Smokeless Tobacco, Cigars, and/or Pipes) Yes, will continue to use the nicotine patches samples and prescription given  Mental Status Per Nursing  Assessment::   On Admission:     Current Mental Status by Physician: IN full contact with reality. States she is ready to do the grief work that she should have done 7 years ago. Will follow up with Hospice. There are no SI plans or intent. States she has been help immensely being here. Plans to continue to work on her issues on an outpatient basis   Loss Factors: Loss of significant relationship  Historical Factors: Anniversary of important loss  Risk Reduction Factors:   Sense of responsibility to family, Religious beliefs about death and Positive social support  Continued Clinical Symptoms:  Depression:   Severe  Cognitive Features That Contribute To Risk:  Polarized thinking and Thought constriction (tunnel vision)    Suicide Risk:  Minimal: No identifiable suicidal ideation.  Patients presenting with no risk factors but with morbid ruminations; may be classified as minimal risk based on the severity of the depressive symptoms  Principal Problem: <principal problem not specified> Discharge Diagnoses:  Patient Active Problem List   Diagnosis Date Noted  . Back pain, lumbosacral [M54.5, M54.89]   . MDD (major depressive disorder), severe [F32.2] 08/29/2014  . Major depressive disorder, recurrent, severe without psychotic features [F33.2]   . HYPERTRIGLYCERIDEMIA [E78.1] 10/08/2006  . Adjustment disorder with mixed anxiety and depressed mood [F43.23] 10/08/2006  . Insomnia [G47.00] 10/08/2006    Follow-up Information    Follow up with Tomma Lightning [facility] On 09/20/2014.   Why:  You are scheduled with Tomma Lightning on Wednesday, September 20, 2014 at 11AM.  Please bring completed registration packet to the appointment.   Contact information:   [address] [place], [place]   [address]  [phone]      [facility] On 10/05/2014.   Why:  Thursday, October 05, 2014 at [facility]PM.  Please arrive at 2:45 PM   Contact information:   [address] [place]   [address]  [phone]      Plan Of Care/Follow-up recommendations:  Activity:  as tolerated Diet:  regular [facility] Is patient on multiple antipsychotic therapies at discharge:  No   Has Patient had three or more failed trials of antipsychotic monotherapy by history:  No  Recommended Plan for Multiple Antipsychotic Therapies: NA    Christy Walker 09/10/2014, 10:37 AM

## 2014-09-10 NOTE — Progress Notes (Signed)
Patient ID: Christy Walker, female   DOB: January 30, 1971, 44 y.o.   MRN: [id number] Patient requested prescription for oxycodone 5/325.  Patient states that she cannot get the medication due to medicaid and financial issues.  Called [facility] and verified patient has not taken the medication since 10/04/2013.  NP agreed to write a script for 2 days.  Patient to be discharged home today.
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
## 2014-09-10 NOTE — [facility] Group Notes (Signed)
[facility] Group Notes:  (Nursing/MHT/Case Management/Adjunct)  Date:  09/10/2014  Time:  0930 am  Type of Therapy:  Psychoeducational Skills  Participation Level:  Active  Participation Quality:  Attentive  Affect:  Appropriate  Cognitive:  Alert and Appropriate  Insight:  Good  Engagement in Group:  Supportive  Modes of Intervention:  Support  Summary of Progress/Problems: Patient discharging today.  Christy Walker states she has learned some coping skills that will assist her at home and elsewhere.  Patient interacted well during group.  Christy Walker, Christy Walker 09/10/2014, 9:57 AM

## 2014-09-13 NOTE — Progress Notes (Signed)
Patient Discharge Instructions:  Next Level Care Provider Has Access to the EMR, 09/13/14  Records provided to [facility] via CHL/Epic access.  Jerelene ReddenSheena E Dunning, 09/13/2014, 3:55 PM

## 2014-09-20 ENCOUNTER — Ambulatory Visit (INDEPENDENT_AMBULATORY_CARE_PROVIDER_SITE_OTHER): Payer: Medicare Other | Admitting: Clinical

## 2014-09-20 ENCOUNTER — Encounter (HOSPITAL_COMMUNITY): Payer: Self-pay | Admitting: Clinical

## 2014-09-20 DIAGNOSIS — F4310 Post-traumatic stress disorder, unspecified: Secondary | ICD-10-CM | POA: Diagnosis not present

## 2014-09-20 DIAGNOSIS — F331 Major depressive disorder, recurrent, moderate: Secondary | ICD-10-CM

## 2014-09-20 NOTE — Progress Notes (Deleted)
   THERAPIST PROGRESS NOTE  Session Time: ***  Participation Level: {BHH PARTICIPATION LEVEL:22264}  Behavioral Response: {Appearance:22683}{BHH LEVEL OF CONSCIOUSNESS:22305}{BHH MOOD:22306}  Type of Therapy: {CHL AMB BH Type of Therapy:21022741}  Treatment Goals addressed: {CHL AMB BH Treatment Goals Addressed:21022754}  Interventions: {CHL AMB BH Type of Intervention:21022753}  Summary: Christy Walker is a 44 y.o. female who presents with ***.   Suicidal/Homicidal: {BHH YES OR NO:22294}{yes/no/with/without intent/plan:22693}  Therapist Response: ***  Plan: Return again in *** weeks.  Diagnosis: Axis I: {psych axis 1:31909}    Axis II: {psych axis 2:31910}    Samarra Ridgely A, LCSW 09/20/2014
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
## 2014-09-26 ENCOUNTER — Ambulatory Visit (HOSPITAL_COMMUNITY): Payer: Self-pay | Admitting: Clinical

## 2014-10-05 ENCOUNTER — Encounter (HOSPITAL_COMMUNITY): Payer: Self-pay | Admitting: Psychiatry

## 2014-10-05 ENCOUNTER — Ambulatory Visit (INDEPENDENT_AMBULATORY_CARE_PROVIDER_SITE_OTHER): Payer: Medicare Other | Admitting: Psychiatry

## 2014-10-05 VITALS — BP 106/72 | HR 78 | Ht 68.0 in | Wt 180.4 lb

## 2014-10-05 DIAGNOSIS — F332 Major depressive disorder, recurrent severe without psychotic features: Secondary | ICD-10-CM

## 2014-10-05 DIAGNOSIS — F411 Generalized anxiety disorder: Secondary | ICD-10-CM | POA: Insufficient documentation

## 2014-10-05 DIAGNOSIS — F4310 Post-traumatic stress disorder, unspecified: Secondary | ICD-10-CM

## 2014-10-05 DIAGNOSIS — F4329 Adjustment disorder with other symptoms: Secondary | ICD-10-CM

## 2014-10-05 DIAGNOSIS — F4321 Adjustment disorder with depressed mood: Secondary | ICD-10-CM | POA: Insufficient documentation

## 2014-10-05 MED ORDER — PRAZOSIN HCL 2 MG PO CAPS: 2.0000 mg | ORAL_CAPSULE | Freq: Every day | ORAL | Status: DC

## 2014-10-05 MED ORDER — FLUOXETINE HCL 40 MG PO CAPS: 40.0000 mg | ORAL_CAPSULE | Freq: Every day | ORAL | Status: DC

## 2014-10-05 MED ORDER — MIRTAZAPINE 30 MG PO TABS: 30.0000 mg | ORAL_TABLET | Freq: Every day | ORAL | Status: DC

## 2014-10-05 MED ORDER — HYDROXYZINE HCL 50 MG PO TABS: 50.0000 mg | ORAL_TABLET | Freq: Every day | ORAL | Status: DC | PRN
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
## 2014-10-06 ENCOUNTER — Encounter (HOSPITAL_COMMUNITY): Payer: Self-pay | Admitting: Clinical

## 2014-10-06 ENCOUNTER — Ambulatory Visit (INDEPENDENT_AMBULATORY_CARE_PROVIDER_SITE_OTHER): Payer: Medicare Other | Admitting: Clinical

## 2014-10-06 DIAGNOSIS — F331 Major depressive disorder, recurrent, moderate: Secondary | ICD-10-CM | POA: Diagnosis not present

## 2014-10-06 DIAGNOSIS — F4310 Post-traumatic stress disorder, unspecified: Secondary | ICD-10-CM

## 2014-10-06 NOTE — Progress Notes (Signed)
   THERAPIST PROGRESS NOTE  Session Time: 12:30 -1:30  Participation Level: Active  Behavioral Response: Casual and NeatAlertDepressed  Type of Therapy: Individual Therapy  Treatment Goals addressed: improve psychiatric symptoms, emotional regulation skills  Interventions: Motivational Interviewing and Other: Grounding and mindfulness techniques psycho education  Summary: Christy Walker is a 45 y.o. female who presents with Major Depressive Disorder and PTSD.   Suicidal/Homicidal: Nowithout intent/plan  Therapist Response:  Christy Walker met with clinician for an individual session. Christy Walker discussed her psychiatric symptoms and her current life events. Christy Walker shared that she had been having trouble sleeping. Christy Walker and clinician discussed Geovana's bed time routine and clinician gave Christy Walker some information on sleep hygiene. Christy Walker agreed to try the techniques, including putting her phone away at bedtime. Christy Walker also shared about the feelings of guilt  that cause her to tear up. Christy Walker and clinician discussed her emotions. Clinician introduced grounding techniques and Christy Walker and clinician practiced several of them together. Christy Walker and clinician discussed how she could allow her children to help her practice the techniques. Christy Walker liked the idea of having them help. She loves her children and would like to have healthier relationships with them. Christy Walker asked clarifying questions which clinician answered. Christy Walker agreed to practice the techniques until next session. Clinician also introduced the idea of keeping a gratitude journal. Clinician explained how she could get the most therapudic benefit out of the process. Christy Walker agrred to keep one as homework until next session   Plan: Return again in 1 week.  Diagnosis: Axis I: Major Depressive Disorder and PTSD.      Armanie Martine A, LCSW 10/06/2014

## 2014-10-09 ENCOUNTER — Telehealth (HOSPITAL_COMMUNITY): Payer: Self-pay | Admitting: *Deleted

## 2014-10-09 NOTE — Telephone Encounter (Signed)
Dr. Michae KavaAgarwal,  [facility].  I called [facility], Medicare will not cover Hydroxyzine not an FDA approved medication.  Cash price is $28.99 Had to leave a message for patient to call me back, not sure if patient can afford $28.99.  If patient cannot afford is there an alternative? Please advise.   Thank you

## 2014-10-10 ENCOUNTER — Other Ambulatory Visit: Payer: Self-pay | Admitting: Physician Assistant

## 2014-10-10 DIAGNOSIS — M545 Low back pain: Secondary | ICD-10-CM

## 2014-10-10 NOTE — Telephone Encounter (Signed)
Tried to reach patient to notify her of situation.  I' am not sure if she has taken Clonidine or Propanolol in the past. If she can afford cash price for Hydroxyzine, may not need to change.  Waiting on patient to call back.  Pharmacy notified to have patient call our office.

## 2014-10-10 NOTE — Telephone Encounter (Signed)
We could try Clonidine or Propanolol if she can't afford Vistaril.

## 2014-10-13 ENCOUNTER — Telehealth (HOSPITAL_COMMUNITY): Payer: Self-pay | Admitting: *Deleted

## 2014-10-13 DIAGNOSIS — F4310 Post-traumatic stress disorder, unspecified: Secondary | ICD-10-CM

## 2014-10-13 NOTE — Telephone Encounter (Signed)
Dr. Michae KavaAgarwal,   What milligram should I send of Propanolol?  Thank you   Oletta DarterSalina Agarwal, MD at 10/10/2014 11:51 AM     Status: Signed       Expand All Collapse All   We could try Clonidine or Propanolol if she can't afford Vistaril

## 2014-10-16 ENCOUNTER — Ambulatory Visit (HOSPITAL_COMMUNITY): Payer: Self-pay | Admitting: Clinical

## 2014-10-17 MED ORDER — PROPRANOLOL HCL 10 MG PO TABS: 10.0000 mg | ORAL_TABLET | Freq: Every day | ORAL | Status: DC

## 2014-10-17 NOTE — Telephone Encounter (Signed)
Called patient advised her we are sending Propanolol 10 mg in place of Hydroxyzine because insurance will not cover Hydroxyzine.  I advised patient per Dr. Michae KavaAgarwal do not take Propanolol with Prazosin and monitor BP with symptoms of dizziness, light headedness, spots in vision. Patient verbalized understanding.   I called [facility] advised of medication change and advised to please make sure patient understands to check BP and not to take medication with her other medicine Prazosin.  I advised [facility] I told the patient, put it on her RX info and would like them make sure patient understands.  [facility] will advise as well to patient the directions for medication and restrictions.

## 2014-10-17 NOTE — Telephone Encounter (Signed)
LMOM for patient to call back.  Would like to discuss what Dr. Michae KavaAgarwal suggest before sending the medication.  LMOM on both numbers for patient to call back.

## 2014-10-17 NOTE — Telephone Encounter (Signed)
We can do Propanolol 10mg  po qD prn anxiety #30 with 1 refill. Tell her not to take it with Prazosin. Monitor for drop in BP with symptoms of dizziness, light headedness, spots in vision, ect.

## 2014-10-19 ENCOUNTER — Telehealth (HOSPITAL_COMMUNITY): Payer: Self-pay | Admitting: *Deleted

## 2014-10-19 DIAGNOSIS — F4310 Post-traumatic stress disorder, unspecified: Secondary | ICD-10-CM

## 2014-10-19 MED ORDER — PROPRANOLOL HCL 10 MG PO TABS: 10.0000 mg | ORAL_TABLET | Freq: Every day | ORAL | Status: DC

## 2014-10-19 NOTE — Telephone Encounter (Signed)
RX was printed out.  Tried to send electronically but computer keeps [employer] Corporationprinting RX. Called RX in. Voided printed RX's and put to be destroyed.

## 2014-10-27 ENCOUNTER — Other Ambulatory Visit: Payer: Self-pay

## 2014-11-07 ENCOUNTER — Other Ambulatory Visit: Payer: Self-pay

## 2014-11-25 ENCOUNTER — Ambulatory Visit
Admission: RE | Admit: 2014-11-25 | Discharge: 2014-11-25 | Disposition: A | Discharge status: Home / Self Care | Payer: Medicare Other | Source: Ambulatory Visit | Attending: Physician Assistant | Admitting: Physician Assistant

## 2014-11-25 DIAGNOSIS — M545 Low back pain: Secondary | ICD-10-CM

## 2014-11-29 ENCOUNTER — Other Ambulatory Visit (HOSPITAL_COMMUNITY): Payer: Self-pay | Admitting: Psychiatry

## 2014-12-05 ENCOUNTER — Ambulatory Visit (HOSPITAL_COMMUNITY): Payer: Self-pay | Admitting: Psychiatry

## 2016-04-29 ENCOUNTER — Other Ambulatory Visit: Payer: Self-pay | Admitting: Obstetrics & Gynecology

## 2016-04-29 DIAGNOSIS — R928 Other abnormal and inconclusive findings on diagnostic imaging of breast: Secondary | ICD-10-CM

## 2016-05-02 ENCOUNTER — Other Ambulatory Visit: Payer: Self-pay

## 2016-05-05 ENCOUNTER — Other Ambulatory Visit: Payer: Self-pay

## 2016-05-05 ENCOUNTER — Inpatient Hospital Stay: Admission: RE | Admit: 2016-05-05 | Payer: Self-pay | Source: Ambulatory Visit

## 2016-05-13 ENCOUNTER — Ambulatory Visit
Admission: RE | Admit: 2016-05-13 | Discharge: 2016-05-13 | Disposition: A | Discharge status: Home / Self Care | Payer: Medicare Other | Source: Ambulatory Visit | Attending: Obstetrics & Gynecology | Admitting: Obstetrics & Gynecology

## 2016-05-13 ENCOUNTER — Other Ambulatory Visit: Payer: Self-pay | Admitting: Obstetrics & Gynecology

## 2016-05-13 DIAGNOSIS — R928 Other abnormal and inconclusive findings on diagnostic imaging of breast: Secondary | ICD-10-CM

## 2016-05-15 ENCOUNTER — Other Ambulatory Visit: Payer: Self-pay | Admitting: Obstetrics & Gynecology

## 2016-05-15 ENCOUNTER — Ambulatory Visit
Admission: RE | Admit: 2016-05-15 | Discharge: 2016-05-15 | Disposition: A | Discharge status: Home / Self Care | Payer: Medicare Other | Source: Ambulatory Visit | Attending: Obstetrics & Gynecology | Admitting: Obstetrics & Gynecology

## 2016-05-15 DIAGNOSIS — R928 Other abnormal and inconclusive findings on diagnostic imaging of breast: Secondary | ICD-10-CM

## 2016-10-23 ENCOUNTER — Other Ambulatory Visit: Payer: Self-pay | Admitting: Obstetrics & Gynecology

## 2016-10-23 DIAGNOSIS — R928 Other abnormal and inconclusive findings on diagnostic imaging of breast: Secondary | ICD-10-CM

## 2016-11-17 ENCOUNTER — Other Ambulatory Visit: Payer: Self-pay

## 2018-02-08 ENCOUNTER — Other Ambulatory Visit: Payer: Self-pay | Admitting: Internal Medicine

## 2018-02-08 DIAGNOSIS — N6489 Other specified disorders of breast: Secondary | ICD-10-CM

## 2018-05-07 ENCOUNTER — Other Ambulatory Visit: Payer: Self-pay | Admitting: Internal Medicine

## 2018-05-07 DIAGNOSIS — N6489 Other specified disorders of breast: Secondary | ICD-10-CM

## 2019-05-25 ENCOUNTER — Other Ambulatory Visit: Payer: Self-pay | Admitting: Physician Assistant

## 2019-05-25 DIAGNOSIS — N6489 Other specified disorders of breast: Secondary | ICD-10-CM

## 2021-08-19 ENCOUNTER — Other Ambulatory Visit: Payer: Self-pay | Admitting: Family Medicine

## 2021-08-19 DIAGNOSIS — F17200 Nicotine dependence, unspecified, uncomplicated: Secondary | ICD-10-CM

## 2021-08-19 DIAGNOSIS — Z1231 Encounter for screening mammogram for malignant neoplasm of breast: Secondary | ICD-10-CM

## 2021-08-19 DIAGNOSIS — F17210 Nicotine dependence, cigarettes, uncomplicated: Secondary | ICD-10-CM

## 2021-08-19 DIAGNOSIS — Z122 Encounter for screening for malignant neoplasm of respiratory organs: Secondary | ICD-10-CM

## 2022-08-25 ENCOUNTER — Ambulatory Visit (HOSPITAL_COMMUNITY)
Admission: EM | Admit: 2022-08-25 | Discharge: 2022-08-25 | Disposition: A | Discharge status: Home / Self Care | Payer: 59 | Attending: Psychiatry | Admitting: Psychiatry

## 2022-08-25 DIAGNOSIS — F4310 Post-traumatic stress disorder, unspecified: Secondary | ICD-10-CM | POA: Diagnosis not present

## 2022-08-25 DIAGNOSIS — F319 Bipolar disorder, unspecified: Secondary | ICD-10-CM | POA: Insufficient documentation

## 2022-08-25 NOTE — Discharge Instructions (Addendum)
Based on the information you have provided and the presenting issue, outpatient services with therapy and psychiatry have been recommended.  It is imperative that you follow through with treatment recommendations within 5-7 days from the of discharge to mitigate further risk to your safety and mental well-being. A list of referrals has been provided below to get you started.  You are not limited to the list provided.  In case of an urgent crisis, you may contact the Mobile Crisis Unit with [facility] at [phone].  Outpatient Therapy and Psychiatry for Medicare Recipients  [facility] [address] [place], [place], [address] [phone] phone  [facility] [address] [place], [place], [address] [phone] phone (Aetna, AmeriHealth [place], BCBS, Cigna, Ever[employer], [employer] Plans, [facility], BCBS Healthy Blue, Humana, [employer], Medcost, Medicare, Medicaid, Optum, Tricare, UHC, UHC Community Plan, Wellcare)  [facility] [address] [place], [place], [address] [phone] phone  [facility] [address] [place], [place], [address] [phone] phone  [facility] [address] [place], [place], [address] [phone] phone [phone] fax  [facility] [address] [place], [place], [address] [phone] phone  [facility] [address] [place], [place], [address] [phone] phone [phone] fax  [facility] [address] [place], [place], [address] [phone] phone  [facility] [address] [place], [place], [address] [phone] phone  [facility] [address] [place], [place], [address] [phone] phone [phone] fax   

## 2022-08-25 NOTE — ED Notes (Signed)
Patient A&O x 4, ambulatory. Patient discharged in no acute distress. Patient denied SI/HI, A/VH upon discharge. Patient verbalized understanding of all discharge instructions explained by staff, to include follow up appointments, and safety plan. Patient reported mood 10/10.  Safety maintained.

## 2022-08-25 NOTE — ED Triage Notes (Signed)
Pt presents to [facility] accompanied by her daughter due to being required to receive an mental health evaluation by DSS. Pt states she would like resources for therapy services. Pt denies SI/HI and AVH.

## 2022-08-25 NOTE — BH Assessment (Signed)
LCSW Progress Note   Per Ronelle Nigh, NP, this pt does not require psychiatric hospitalization at this time.  Pt is psychiatrically cleared.  Discharge instructions include several resources for basic mental health services such as outpatient therapy and medication management.  EDP Ronelle Nigh, NP, has been notified.  Omelia Blackwater, MSW, [place] [phone] or [phone]
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
## 2022-12-30 ENCOUNTER — Other Ambulatory Visit (HOSPITAL_BASED_OUTPATIENT_CLINIC_OR_DEPARTMENT_OTHER): Payer: Self-pay

## 2022-12-30 ENCOUNTER — Other Ambulatory Visit: Payer: Self-pay

## 2023-01-19 ENCOUNTER — Emergency Department (HOSPITAL_COMMUNITY): Payer: 59

## 2023-01-19 ENCOUNTER — Inpatient Hospital Stay (HOSPITAL_COMMUNITY)
Admission: EM | Admit: 2023-01-19 | Discharge: 2023-02-09 | DRG: 871 | Disposition: E | Payer: 59 | Attending: Internal Medicine | Admitting: Internal Medicine

## 2023-01-19 ENCOUNTER — Encounter (HOSPITAL_COMMUNITY): Payer: Self-pay | Admitting: Pharmacy Technician

## 2023-01-19 DIAGNOSIS — G931 Anoxic brain damage, not elsewhere classified: Secondary | ICD-10-CM | POA: Diagnosis present

## 2023-01-19 DIAGNOSIS — F4310 Post-traumatic stress disorder, unspecified: Secondary | ICD-10-CM | POA: Diagnosis present

## 2023-01-19 DIAGNOSIS — G9341 Metabolic encephalopathy: Secondary | ICD-10-CM | POA: Diagnosis present

## 2023-01-19 DIAGNOSIS — E875 Hyperkalemia: Secondary | ICD-10-CM | POA: Diagnosis present

## 2023-01-19 DIAGNOSIS — A419 Sepsis, unspecified organism: Principal | ICD-10-CM | POA: Diagnosis present

## 2023-01-19 DIAGNOSIS — E8729 Other acidosis: Secondary | ICD-10-CM | POA: Diagnosis present

## 2023-01-19 DIAGNOSIS — J9601 Acute respiratory failure with hypoxia: Secondary | ICD-10-CM | POA: Diagnosis present

## 2023-01-19 DIAGNOSIS — D689 Coagulation defect, unspecified: Secondary | ICD-10-CM | POA: Diagnosis present

## 2023-01-19 DIAGNOSIS — R6521 Severe sepsis with septic shock: Secondary | ICD-10-CM | POA: Diagnosis present

## 2023-01-19 DIAGNOSIS — I469 Cardiac arrest, cause unspecified: Principal | ICD-10-CM | POA: Diagnosis present

## 2023-01-19 DIAGNOSIS — Z87891 Personal history of nicotine dependence: Secondary | ICD-10-CM

## 2023-01-19 DIAGNOSIS — R578 Other shock: Secondary | ICD-10-CM | POA: Diagnosis present

## 2023-01-19 DIAGNOSIS — I10 Essential (primary) hypertension: Secondary | ICD-10-CM | POA: Diagnosis present

## 2023-01-19 DIAGNOSIS — J690 Pneumonitis due to inhalation of food and vomit: Secondary | ICD-10-CM | POA: Diagnosis present

## 2023-01-19 DIAGNOSIS — I468 Cardiac arrest due to other underlying condition: Secondary | ICD-10-CM | POA: Diagnosis present

## 2023-01-19 DIAGNOSIS — Z811 Family history of alcohol abuse and dependence: Secondary | ICD-10-CM

## 2023-01-19 DIAGNOSIS — E781 Pure hyperglyceridemia: Secondary | ICD-10-CM | POA: Diagnosis present

## 2023-01-19 DIAGNOSIS — F329 Major depressive disorder, single episode, unspecified: Secondary | ICD-10-CM | POA: Diagnosis present

## 2023-01-19 DIAGNOSIS — E162 Hypoglycemia, unspecified: Secondary | ICD-10-CM | POA: Diagnosis present

## 2023-01-19 DIAGNOSIS — Z79899 Other long term (current) drug therapy: Secondary | ICD-10-CM

## 2023-01-19 DIAGNOSIS — Z515 Encounter for palliative care: Secondary | ICD-10-CM

## 2023-01-19 DIAGNOSIS — N179 Acute kidney failure, unspecified: Secondary | ICD-10-CM | POA: Diagnosis present

## 2023-01-19 DIAGNOSIS — J9602 Acute respiratory failure with hypercapnia: Secondary | ICD-10-CM | POA: Diagnosis present

## 2023-01-19 DIAGNOSIS — T17908A Unspecified foreign body in respiratory tract, part unspecified causing other injury, initial encounter: Secondary | ICD-10-CM

## 2023-01-19 DIAGNOSIS — Z66 Do not resuscitate: Secondary | ICD-10-CM | POA: Diagnosis present

## 2023-01-19 DIAGNOSIS — K56609 Unspecified intestinal obstruction, unspecified as to partial versus complete obstruction: Secondary | ICD-10-CM | POA: Diagnosis present

## 2023-01-19 DIAGNOSIS — E861 Hypovolemia: Secondary | ICD-10-CM | POA: Diagnosis present

## 2023-01-19 DIAGNOSIS — F419 Anxiety disorder, unspecified: Secondary | ICD-10-CM | POA: Diagnosis present

## 2023-01-19 DIAGNOSIS — E8721 Acute metabolic acidosis: Secondary | ICD-10-CM | POA: Diagnosis present

## 2023-01-19 DIAGNOSIS — Z818 Family history of other mental and behavioral disorders: Secondary | ICD-10-CM

## 2023-01-19 DIAGNOSIS — I214 Non-ST elevation (NSTEMI) myocardial infarction: Secondary | ICD-10-CM

## 2023-01-19 LAB — I-STAT VENOUS BLOOD GAS, ED
Acid-base deficit: 30 mmol/L — ABNORMAL HIGH (ref 0.0–2.0)
Bicarbonate: 5.9 mmol/L — ABNORMAL LOW (ref 20.0–28.0)
Calcium, Ion: 0.83 mmol/L — CL (ref 1.15–1.40)
HCT: 37 % (ref 36.0–46.0)
Hemoglobin: 12.6 g/dL (ref 12.0–15.0)
O2 Saturation: 58 %
Potassium: 5.6 mmol/L — ABNORMAL HIGH (ref 3.5–5.1)
Sodium: 143 mmol/L (ref 135–145)
TCO2: 8 mmol/L — ABNORMAL LOW (ref 22–32)
pCO2, Ven: 53.3 mmHg (ref 44–60)
pH, Ven: 6.654 — CL (ref 7.25–7.43)
pO2, Ven: 66 mmHg — ABNORMAL HIGH (ref 32–45)

## 2023-01-19 LAB — CBC WITH DIFFERENTIAL/PLATELET
Abs Immature Granulocytes: 1.4 10*3/uL — ABNORMAL HIGH (ref 0.00–0.07)
Band Neutrophils: 6 %
Basophils Absolute: 0 10*3/uL (ref 0.0–0.1)
Basophils Relative: 0 %
Eosinophils Absolute: 0 10*3/uL (ref 0.0–0.5)
Eosinophils Relative: 0 %
HCT: 42.2 % (ref 36.0–46.0)
Hemoglobin: 11.5 g/dL — ABNORMAL LOW (ref 12.0–15.0)
Lymphocytes Relative: 72 %
Lymphs Abs: 9.2 10*3/uL — ABNORMAL HIGH (ref 0.7–4.0)
MCH: 24.5 pg — ABNORMAL LOW (ref 26.0–34.0)
MCHC: 27.3 g/dL — ABNORMAL LOW (ref 30.0–36.0)
MCV: 90 fL (ref 80.0–100.0)
Metamyelocytes Relative: 5 %
Monocytes Absolute: 0.1 10*3/uL (ref 0.1–1.0)
Monocytes Relative: 1 %
Myelocytes: 4 %
Neutro Abs: 2 10*3/uL (ref 1.7–7.7)
Neutrophils Relative %: 10 %
Platelets: 192 10*3/uL (ref 150–400)
Promyelocytes Relative: 2 %
RBC: 4.69 MIL/uL (ref 3.87–5.11)
RDW: 18.4 % — ABNORMAL HIGH (ref 11.5–15.5)
WBC: 12.8 10*3/uL — ABNORMAL HIGH (ref 4.0–10.5)
nRBC: 27 /100 WBC — ABNORMAL HIGH
nRBC: 5.3 % — ABNORMAL HIGH (ref 0.0–0.2)

## 2023-01-19 LAB — I-STAT ARTERIAL BLOOD GAS, ED
Acid-base deficit: 26 mmol/L — ABNORMAL HIGH (ref 0.0–2.0)
Bicarbonate: 11.2 mmol/L — ABNORMAL LOW (ref 20.0–28.0)
Calcium, Ion: 0.84 mmol/L — CL (ref 1.15–1.40)
HCT: 31 % — ABNORMAL LOW (ref 36.0–46.0)
Hemoglobin: 10.5 g/dL — ABNORMAL LOW (ref 12.0–15.0)
O2 Saturation: 88 %
Patient temperature: 98.1
Potassium: 6.6 mmol/L (ref 3.5–5.1)
Sodium: 142 mmol/L (ref 135–145)
TCO2: 14 mmol/L — ABNORMAL LOW (ref 22–32)
pCO2 arterial: 99.8 mmHg (ref 32–48)
pH, Arterial: 6.654 — CL (ref 7.35–7.45)
pO2, Arterial: 118 mmHg — ABNORMAL HIGH (ref 83–108)

## 2023-01-19 LAB — I-STAT CHEM 8, ED
BUN: 61 mg/dL — ABNORMAL HIGH (ref 6–20)
Calcium, Ion: 0.83 mmol/L — CL (ref 1.15–1.40)
Chloride: 115 mmol/L — ABNORMAL HIGH (ref 98–111)
Creatinine, Ser: 7.3 mg/dL — ABNORMAL HIGH (ref 0.44–1.00)
Glucose, Bld: 23 mg/dL — CL (ref 70–99)
HCT: 39 % (ref 36.0–46.0)
Hemoglobin: 13.3 g/dL (ref 12.0–15.0)
Potassium: 5.6 mmol/L — ABNORMAL HIGH (ref 3.5–5.1)
Sodium: 144 mmol/L (ref 135–145)
TCO2: 10 mmol/L — ABNORMAL LOW (ref 22–32)

## 2023-01-19 LAB — LIPID PANEL
Cholesterol: 141 mg/dL (ref 0–200)
HDL: 24 mg/dL — ABNORMAL LOW (ref >40)
LDL Cholesterol: 52 mg/dL (ref 0–99)
Total CHOL/HDL Ratio: 5.9 RATIO
Triglycerides: 323 mg/dL — ABNORMAL HIGH (ref <150)
VLDL: 65 mg/dL — ABNORMAL HIGH (ref 0–40)

## 2023-01-19 LAB — URINALYSIS, W/ REFLEX TO CULTURE (INFECTION SUSPECTED)
Bilirubin Urine: NEGATIVE
Glucose, UA: NEGATIVE mg/dL
Ketones, ur: NEGATIVE mg/dL
Leukocytes,Ua: NEGATIVE
Nitrite: NEGATIVE
Protein, ur: 100 mg/dL — AB
Specific Gravity, Urine: 1.018 (ref 1.005–1.030)
Squamous Epithelial / HPF: >50 /HPF (ref 0–5)
pH: 5 (ref 5.0–8.0)

## 2023-01-19 LAB — BASIC METABOLIC PANEL
Anion gap: 30 — ABNORMAL HIGH (ref 5–15)
BUN: 47 mg/dL — ABNORMAL HIGH (ref 6–20)
CO2: 14 mmol/L — ABNORMAL LOW (ref 22–32)
Calcium: 9.6 mg/dL (ref 8.9–10.3)
Chloride: 96 mmol/L — ABNORMAL LOW (ref 98–111)
Creatinine, Ser: 7.59 mg/dL — ABNORMAL HIGH (ref 0.44–1.00)
GFR, Estimated: 6 mL/min — ABNORMAL LOW (ref >60)
Glucose, Bld: 271 mg/dL — ABNORMAL HIGH (ref 70–99)
Potassium: 5.5 mmol/L — ABNORMAL HIGH (ref 3.5–5.1)
Sodium: 140 mmol/L (ref 135–145)

## 2023-01-19 LAB — CBC
HCT: 39 % (ref 36.0–46.0)
Hemoglobin: 10.8 g/dL — ABNORMAL LOW (ref 12.0–15.0)
MCH: 23.6 pg — ABNORMAL LOW (ref 26.0–34.0)
MCHC: 27.7 g/dL — ABNORMAL LOW (ref 30.0–36.0)
MCV: 85.2 fL (ref 80.0–100.0)
Platelets: 168 10*3/uL (ref 150–400)
RBC: 4.58 MIL/uL (ref 3.87–5.11)
RDW: 18.4 % — ABNORMAL HIGH (ref 11.5–15.5)
WBC: 8.9 10*3/uL (ref 4.0–10.5)
nRBC: 5.7 % — ABNORMAL HIGH (ref 0.0–0.2)

## 2023-01-19 LAB — CBG MONITORING, ED
Glucose-Capillary: 192 mg/dL — ABNORMAL HIGH (ref 70–99)
Glucose-Capillary: <10 mg/dL — CL (ref 70–99)
Glucose-Capillary: 180 mg/dL — ABNORMAL HIGH (ref 70–99)

## 2023-01-19 LAB — GLUCOSE, CAPILLARY
Glucose-Capillary: 201 mg/dL — ABNORMAL HIGH (ref 70–99)
Glucose-Capillary: 241 mg/dL — ABNORMAL HIGH (ref 70–99)

## 2023-01-19 LAB — COMPREHENSIVE METABOLIC PANEL
ALT: 1543 U/L — ABNORMAL HIGH (ref 0–44)
AST: 2688 U/L — ABNORMAL HIGH (ref 15–41)
Albumin: 2.6 g/dL — ABNORMAL LOW (ref 3.5–5.0)
Alkaline Phosphatase: 81 U/L (ref 38–126)
BUN: 48 mg/dL — ABNORMAL HIGH (ref 6–20)
CO2: <7 mmol/L — ABNORMAL LOW (ref 22–32)
Calcium: 8 mg/dL — ABNORMAL LOW (ref 8.9–10.3)
Chloride: 103 mmol/L (ref 98–111)
Creatinine, Ser: 7.38 mg/dL — ABNORMAL HIGH (ref 0.44–1.00)
GFR, Estimated: 6 mL/min — ABNORMAL LOW (ref >60)
Glucose, Bld: 27 mg/dL — CL (ref 70–99)
Potassium: 5.9 mmol/L — ABNORMAL HIGH (ref 3.5–5.1)
Sodium: 150 mmol/L — ABNORMAL HIGH (ref 135–145)
Total Bilirubin: 0.6 mg/dL (ref 0.3–1.2)
Total Protein: 5 g/dL — ABNORMAL LOW (ref 6.5–8.1)

## 2023-01-19 LAB — RAPID URINE DRUG SCREEN, HOSP PERFORMED
Amphetamines: POSITIVE — AB
Barbiturates: NOT DETECTED
Benzodiazepines: NOT DETECTED
Cocaine: NOT DETECTED
Opiates: NOT DETECTED
Tetrahydrocannabinol: POSITIVE — AB

## 2023-01-19 LAB — ETHANOL: Alcohol, Ethyl (B): <10 mg/dL (ref <10)

## 2023-01-19 LAB — TROPONIN I (HIGH SENSITIVITY)
Troponin I (High Sensitivity): 17849 ng/L (ref <18)
Troponin I (High Sensitivity): 20006 ng/L (ref <18)

## 2023-01-19 LAB — APTT: aPTT: 57 seconds — ABNORMAL HIGH (ref 24–36)

## 2023-01-19 LAB — PROTIME-INR
INR: 2.3 — ABNORMAL HIGH (ref 0.8–1.2)
Prothrombin Time: 25.5 seconds — ABNORMAL HIGH (ref 11.4–15.2)

## 2023-01-19 LAB — SALICYLATE LEVEL: Salicylate Lvl: 8.4 mg/dL (ref 7.0–30.0)

## 2023-01-19 LAB — LACTIC ACID, PLASMA
Lactic Acid, Venous: >9 mmol/L (ref 0.5–1.9)
Lactic Acid, Venous: >9 mmol/L (ref 0.5–1.9)

## 2023-01-19 LAB — HEMOGLOBIN A1C
Hgb A1c MFr Bld: 5.7 % — ABNORMAL HIGH (ref 4.8–5.6)
Mean Plasma Glucose: 116.89 mg/dL

## 2023-01-19 LAB — MRSA NEXT GEN BY PCR, NASAL: MRSA by PCR Next Gen: DETECTED — AB

## 2023-01-19 LAB — HIV ANTIBODY (ROUTINE TESTING W REFLEX): HIV Screen 4th Generation wRfx: NONREACTIVE

## 2023-01-19 LAB — ACETAMINOPHEN LEVEL: Acetaminophen (Tylenol), Serum: <10 ug/mL — ABNORMAL LOW (ref 10–30)

## 2023-01-19 LAB — I-STAT BETA HCG BLOOD, ED (MC, WL, AP ONLY): I-stat hCG, quantitative: <5 m[IU]/mL (ref <5)

## 2023-01-19 MED ORDER — DOCUSATE SODIUM 50 MG/5ML PO LIQD: 100.0000 mg | Freq: Two times a day (BID) | ORAL | Status: DC

## 2023-01-19 MED ORDER — POLYETHYLENE GLYCOL 3350 17 G PO PACK: 17.0000 g | PACK | Freq: Every day | ORAL | Status: DC | PRN

## 2023-01-19 MED ORDER — ASPIRIN 81 MG PO CHEW: 324.0000 mg | CHEWABLE_TABLET | Freq: Once | ORAL | Status: DC

## 2023-01-19 MED ORDER — ACETAMINOPHEN 160 MG/5ML PO SOLN
650.0000 mg | ORAL | Status: DC
  Administered 2023-01-19: 650 mg
  Filled 2023-01-19: qty 20.3

## 2023-01-19 MED ORDER — POLYVINYL ALCOHOL 1.4 % OP SOLN: 1.0000 [drp] | Freq: Four times a day (QID) | OPHTHALMIC | Status: DC | PRN

## 2023-01-19 MED ORDER — CALCIUM GLUCONATE-NACL 1-0.675 GM/50ML-% IV SOLN
1.0000 g | Freq: Once | INTRAVENOUS | Status: AC
  Administered 2023-01-19: 1000 mg via INTRAVENOUS
  Filled 2023-01-19: qty 50

## 2023-01-19 MED ORDER — ONDANSETRON HCL 4 MG/2ML IJ SOLN: 4.0000 mg | Freq: Four times a day (QID) | INTRAMUSCULAR | Status: DC | PRN

## 2023-01-19 MED ORDER — HEPARIN SODIUM (PORCINE) 5000 UNIT/ML IJ SOLN
60.0000 [IU]/kg | Freq: Once | INTRAMUSCULAR | Status: DC
Start: 2023-01-19 — End: 2023-01-19

## 2023-01-19 MED ORDER — CALCIUM CHLORIDE 10 % IV SOLN
1.0000 g | Freq: Once | INTRAVENOUS | Status: AC
  Administered 2023-01-19: 1 g via INTRAVENOUS

## 2023-01-19 MED ORDER — SODIUM CHLORIDE 0.9 % IV SOLN: INTRAVENOUS | Status: DC | PRN

## 2023-01-19 MED ORDER — CALCIUM CHLORIDE 10 % IV SOLN
INTRAVENOUS | Status: AC
  Administered 2023-01-19: 1000 mg
  Filled 2023-01-19: qty 10

## 2023-01-19 MED ORDER — SODIUM BICARBONATE 8.4 % IV SOLN
50.0000 meq | Freq: Once | INTRAVENOUS | Status: AC
  Administered 2023-01-19: 50 meq via INTRAVENOUS
  Filled 2023-01-19: qty 50

## 2023-01-19 MED ORDER — SODIUM BICARBONATE 8.4 % IV SOLN: 50.0000 meq | Freq: Once | INTRAVENOUS | Status: AC

## 2023-01-19 MED ORDER — SODIUM BICARBONATE 8.4 % IV SOLN
INTRAVENOUS | Status: DC | PRN
  Administered 2023-01-19 (×2): 50 meq via INTRAVENOUS

## 2023-01-19 MED ORDER — CHLORHEXIDINE GLUCONATE CLOTH 2 % EX PADS
6.0000 | MEDICATED_PAD | Freq: Every day | CUTANEOUS | Status: DC
  Administered 2023-01-19: 6 via TOPICAL

## 2023-01-19 MED ORDER — ACETAMINOPHEN 650 MG RE SUPP: 650.0000 mg | RECTAL | Status: DC

## 2023-01-19 MED ORDER — FAMOTIDINE 20 MG PO TABS
20.0000 mg | ORAL_TABLET | Freq: Every day | ORAL | Status: DC
  Administered 2023-01-19: 20 mg
  Filled 2023-01-19: qty 1

## 2023-01-19 MED ORDER — MIDAZOLAM HCL 2 MG/2ML IJ SOLN
2.0000 mg | INTRAMUSCULAR | Status: DC | PRN
  Administered 2023-01-20: 4 mg via INTRAVENOUS
  Filled 2023-01-19: qty 4

## 2023-01-19 MED ORDER — FENTANYL CITRATE PF 50 MCG/ML IJ SOSY: 25.0000 ug | PREFILLED_SYRINGE | INTRAMUSCULAR | Status: DC | PRN

## 2023-01-19 MED ORDER — IOHEXOL 350 MG/ML SOLN
75.0000 mL | Freq: Once | INTRAVENOUS | Status: AC | PRN
  Administered 2023-01-19: 75 mL via INTRAVENOUS

## 2023-01-19 MED ORDER — HEPARIN SODIUM (PORCINE) 5000 UNIT/ML IJ SOLN
5000.0000 [IU] | Freq: Three times a day (TID) | INTRAMUSCULAR | Status: DC
  Administered 2023-01-19: 5000 [IU] via SUBCUTANEOUS
  Filled 2023-01-19: qty 1

## 2023-01-19 MED ORDER — ACETAMINOPHEN 650 MG RE SUPP: 650.0000 mg | Freq: Four times a day (QID) | RECTAL | Status: DC | PRN

## 2023-01-19 MED ORDER — SODIUM CHLORIDE 0.9 % IV SOLN: INTRAVENOUS | Status: DC

## 2023-01-19 MED ORDER — BUSPIRONE HCL 10 MG PO TABS: 30.0000 mg | ORAL_TABLET | Freq: Three times a day (TID) | ORAL | Status: DC | PRN

## 2023-01-19 MED ORDER — POLYETHYLENE GLYCOL 3350 17 G PO PACK: 17.0000 g | PACK | Freq: Every day | ORAL | Status: DC

## 2023-01-19 MED ORDER — ACETAMINOPHEN 650 MG RE SUPP: 650.0000 mg | RECTAL | Status: DC | PRN

## 2023-01-19 MED ORDER — SODIUM CHLORIDE 0.9 % IV SOLN: 3.0000 g | INTRAVENOUS | Status: DC

## 2023-01-19 MED ORDER — ONDANSETRON 4 MG PO TBDP: 4.0000 mg | ORAL_TABLET | Freq: Four times a day (QID) | ORAL | Status: DC | PRN

## 2023-01-19 MED ORDER — ORAL CARE MOUTH RINSE: 15.0000 mL | OROMUCOSAL | Status: DC | PRN

## 2023-01-19 MED ORDER — GLYCOPYRROLATE 1 MG PO TABS: 1.0000 mg | ORAL_TABLET | ORAL | Status: DC | PRN

## 2023-01-19 MED ORDER — SODIUM BICARBONATE 8.4 % IV SOLN
INTRAVENOUS | Status: DC
  Filled 2023-01-19 (×3): qty 1000

## 2023-01-19 MED ORDER — ACETAMINOPHEN 160 MG/5ML PO SOLN
650.0000 mg | ORAL | Status: DC | PRN
Start: 2023-01-21 — End: 2023-01-19

## 2023-01-19 MED ORDER — VASOPRESSIN 20 UNITS/100 ML INFUSION FOR SHOCK
0.0000 [IU]/min | INTRAVENOUS | Status: DC
  Administered 2023-01-19: 0.03 [IU]/min via INTRAVENOUS
  Filled 2023-01-19 (×2): qty 100

## 2023-01-19 MED ORDER — SODIUM CHLORIDE 0.9 % IV SOLN
3.0000 g | Freq: Once | INTRAVENOUS | Status: AC
  Administered 2023-01-19: 3 g via INTRAVENOUS
  Filled 2023-01-19: qty 8

## 2023-01-19 MED ORDER — FENTANYL BOLUS VIA INFUSION: 25.0000 ug | INTRAVENOUS | Status: DC | PRN

## 2023-01-19 MED ORDER — GLYCOPYRROLATE 0.2 MG/ML IJ SOLN: 0.2000 mg | INTRAMUSCULAR | Status: DC | PRN

## 2023-01-19 MED ORDER — ORAL CARE MOUTH RINSE: 15.0000 mL | OROMUCOSAL | Status: DC

## 2023-01-19 MED ORDER — CALCIUM CHLORIDE 10 % IV SOLN
INTRAVENOUS | Status: DC | PRN
  Administered 2023-01-19: 1 g via INTRAVENOUS

## 2023-01-19 MED ORDER — SODIUM CHLORIDE 0.9 % IV SOLN
250.0000 mL | INTRAVENOUS | Status: DC
  Administered 2023-01-19 (×2): 250 mL via INTRAVENOUS

## 2023-01-19 MED ORDER — DEXTROSE 50 % IV SOLN
INTRAVENOUS | Status: DC | PRN
  Administered 2023-01-19: 1 via INTRAVENOUS

## 2023-01-19 MED ORDER — ACETAMINOPHEN 325 MG PO TABS: 650.0000 mg | ORAL_TABLET | Freq: Four times a day (QID) | ORAL | Status: DC | PRN

## 2023-01-19 MED ORDER — FAMOTIDINE 20 MG PO TABS: 20.0000 mg | ORAL_TABLET | Freq: Two times a day (BID) | ORAL | Status: DC

## 2023-01-19 MED ORDER — NOREPINEPHRINE 4 MG/250ML-% IV SOLN
2.0000 ug/min | INTRAVENOUS | Status: DC
Start: 2023-01-19 — End: 2023-01-19

## 2023-01-19 MED ORDER — FENTANYL 2500MCG IN NS 250ML (10MCG/ML) PREMIX INFUSION
25.0000 ug/h | INTRAVENOUS | Status: DC
  Administered 2023-01-19: 50 ug/h via INTRAVENOUS
  Filled 2023-01-19: qty 250

## 2023-01-19 MED ORDER — ACETAMINOPHEN 325 MG PO TABS: 650.0000 mg | ORAL_TABLET | ORAL | Status: DC

## 2023-01-19 MED ORDER — PHENYLEPHRINE 80 MCG/ML (10ML) SYRINGE FOR IV PUSH (FOR BLOOD PRESSURE SUPPORT)
PREFILLED_SYRINGE | INTRAVENOUS | Status: DC | PRN
  Administered 2023-01-19 (×2): 160 ug via INTRAVENOUS

## 2023-01-19 MED ORDER — EPINEPHRINE HCL 5 MG/250ML IV SOLN IN NS
0.5000 ug/min | INTRAVENOUS | Status: DC
  Administered 2023-01-19: 10 ug/min via INTRAVENOUS
  Administered 2023-01-19 (×2): 30 ug/min via INTRAVENOUS
  Filled 2023-01-19 (×2): qty 250

## 2023-01-19 MED ORDER — INSULIN ASPART 100 UNIT/ML IJ SOLN
0.0000 [IU] | INTRAMUSCULAR | Status: DC
  Administered 2023-01-19: 5 [IU] via SUBCUTANEOUS

## 2023-01-19 MED ORDER — DOCUSATE SODIUM 100 MG PO CAPS: 100.0000 mg | ORAL_CAPSULE | Freq: Two times a day (BID) | ORAL | Status: DC | PRN

## 2023-01-19 MED ORDER — CALCIUM GLUCONATE-NACL 2-0.675 GM/100ML-% IV SOLN
2.0000 g | Freq: Once | INTRAVENOUS | Status: AC
  Administered 2023-01-19: 2000 mg via INTRAVENOUS
  Filled 2023-01-19: qty 100

## 2023-01-19 MED ORDER — GLYCOPYRROLATE 0.2 MG/ML IJ SOLN
0.2000 mg | INTRAMUSCULAR | Status: DC | PRN
  Administered 2023-01-20: 0.2 mg via INTRAVENOUS
  Filled 2023-01-19: qty 1

## 2023-01-19 MED ORDER — NOREPINEPHRINE 4 MG/250ML-% IV SOLN
0.0000 ug/min | INTRAVENOUS | Status: DC
  Administered 2023-01-19: 10 ug/min via INTRAVENOUS
  Administered 2023-01-19: 60 ug/min via INTRAVENOUS
  Administered 2023-01-19: 58 ug/min via INTRAVENOUS
  Administered 2023-01-19 (×2): 60 ug/min via INTRAVENOUS
  Filled 2023-01-19 (×4): qty 250

## 2023-01-19 MED ORDER — SODIUM BICARBONATE 8.4 % IV SOLN
INTRAVENOUS | Status: AC
  Administered 2023-01-19: 50 meq
  Filled 2023-01-19: qty 50

## 2023-01-19 MED ORDER — HALOPERIDOL LACTATE 5 MG/ML IJ SOLN: 2.5000 mg | INTRAMUSCULAR | Status: DC | PRN

## 2023-01-19 MED ORDER — PROPOFOL 1000 MG/100ML IV EMUL: 0.0000 ug/kg/min | INTRAVENOUS | Status: DC

## 2023-01-19 MED ORDER — ACETAMINOPHEN 325 MG PO TABS
650.0000 mg | ORAL_TABLET | ORAL | Status: DC | PRN
Start: 2023-01-21 — End: 2023-01-19

## 2023-01-19 MED ORDER — MAGNESIUM SULFATE 2 GM/50ML IV SOLN: 2.0000 g | Freq: Once | INTRAVENOUS | Status: DC | PRN

## 2023-01-19 NOTE — Plan of Care (Signed)
  Interdisciplinary Goals of Care Family Meeting   Date carried out:: 02/03/2023  Location of the meeting: Bedside  Member's involved: Physician, Bedside Registered Nurse, and Other: chaplain   Durable Power of Attorney or acting medical decision maker: Pt's daughter Toni Amend    Discussion: We discussed goals of care for Christy Walker .  Multiple family members were at the bedside when PCCM was notified that patient is currently on maximal doses of levophed, vasopressin and epinephrine along with bicarb pushes and drip with continued hypotension.  She is unresponsive off sedation.  We discussed that further chest compressions and shocks would likely not change patient's outcome.   Family agreed with changing code status to DNR. We will continue with the current level of care to give family more time.   Code status: Full DNR  Disposition: Continue current acute care   Time spent for the meeting: 20 minutes  Darcella Gasman Kamarii Carton 01/11/2023, 8:01 PM

## 2023-01-19 NOTE — ED Provider Notes (Signed)
[place] EMERGENCY DEPARTMENT AT [facility] Provider Note   CSN: [id number] Arrival date & time: 25-Jan-2023  1737     History {Add pertinent medical, surgical, social history, OB history to HPI:1} Chief Complaint  Patient presents with   Post CPR    Christy Walker is a 52 y.o. female with *** presents with post-cardiac arrest. .     HPI     Home Medications Prior to Admission medications   Medication Sig Start Date End Date Taking? Authorizing Provider  FLUoxetine (PROZAC) 40 MG capsule TAKE ONE CAPSULE BY MOUTH EVERY DAY 11/30/14   Oletta Darter, MD  lidocaine (LIDODERM) 5 % Place 1 patch onto the skin daily. Remove & Discard patch within 12 hours or as directed by MD Patient not taking: Reported on 10/05/2014 09/10/14   Canary Brim, NP  mirtazapine (REMERON) 30 MG tablet Take 1 tablet (30 mg total) by mouth at bedtime. 10/05/14   Oletta Darter, MD  nicotine (NICODERM CQ - DOSED IN MG/24 HOURS) 21 mg/24hr patch Place 1 patch (21 mg total) onto the skin daily. Patient not taking: Reported on 10/05/2014 09/10/14   Canary Brim, NP  oxyCODONE-acetaminophen (PERCOCET/ROXICET) 5-325 MG per tablet Take 1 tablet by mouth 2 (two) times daily as needed for severe pain (pain.). 09/10/14   Canary Brim, NP  prazosin (MINIPRESS) 2 MG capsule Take 1 capsule (2 mg total) by mouth at bedtime. 10/05/14   Oletta Darter, MD  propranolol (INDERAL) 10 MG tablet Take 1 tablet (10 mg total) by mouth daily. 10/19/14   Oletta Darter, MD      Allergies    Patient has no known allergies.    Review of Systems   Review of Systems  Unable to perform ROS: Patient unresponsive    Physical Exam Updated Vital Signs BP (!) 91/46   Pulse 90   Resp 16   Ht 5\' 8"  (1.727 m)   SpO2 (!) 80%   BMI 27.43 kg/m  Physical Exam ***General: Normal appearing {Desc; female/female:11659}, lying in bed.  HEENT: PERRLA, Sclera anicteric, MMM, trachea midline.  Cardiology: RRR, no  murmurs/rubs/gallops. BL radial and DP pulses equal bilaterally.  Resp: Normal respiratory rate and effort. CTAB, no wheezes, rhonchi, crackles.  Abd: Soft, non-tender, non-distended. No rebound tenderness or guarding.  GU: Deferred. MSK: No peripheral edema or signs of trauma. Extremities without deformity or TTP. No cyanosis or clubbing. Skin: warm, dry. No rashes or lesions. Back: No CVA tenderness Neuro: A&Ox4, CNs II-XII grossly intact. MAEs. Sensation grossly intact.  Psych: Normal mood and affect.   ED Results / Procedures / Treatments   Labs (all labs ordered are listed, but only abnormal results are displayed) Labs Reviewed  CBC WITH DIFFERENTIAL/PLATELET - Abnormal; Notable for the following components:      Result Value   WBC 12.8 (*)    Hemoglobin 11.5 (*)    MCH 24.5 (*)    MCHC 27.3 (*)    RDW 18.4 (*)    nRBC 5.3 (*)    Lymphs Abs 9.2 (*)    nRBC 27 (*)    Abs Immature Granulocytes 1.40 (*)    All other components within normal limits  PROTIME-INR - Abnormal; Notable for the following components:   Prothrombin Time 25.5 (*)    INR 2.3 (*)    All other components within normal limits  APTT - Abnormal; Notable for the following components:   aPTT 57 (*)    All other  components within normal limits  I-STAT VENOUS BLOOD GAS, ED - Abnormal; Notable for the following components:   pH, Ven 6.654 (*)    pO2, Ven 66 (*)    Bicarbonate 5.9 (*)    TCO2 8 (*)    Acid-base deficit 30.0 (*)    Potassium 5.6 (*)    Calcium, Ion 0.83 (*)    All other components within normal limits  I-STAT CHEM 8, ED - Abnormal; Notable for the following components:   Potassium 5.6 (*)    Chloride 115 (*)    BUN 61 (*)    Creatinine, Ser 7.30 (*)    Glucose, Bld 23 (*)    Calcium, Ion 0.83 (*)    TCO2 10 (*)    All other components within normal limits  CBG MONITORING, ED - Abnormal; Notable for the following components:   Glucose-Capillary <10 (*)    All other components within  normal limits  CBG MONITORING, ED - Abnormal; Notable for the following components:   Glucose-Capillary 192 (*)    All other components within normal limits  COMPREHENSIVE METABOLIC PANEL  LIPID PANEL  LACTIC ACID, PLASMA  LACTIC ACID, PLASMA  RAPID URINE DRUG SCREEN, HOSP PERFORMED  URINALYSIS, W/ REFLEX TO CULTURE (INFECTION SUSPECTED)  SALICYLATE LEVEL  ACETAMINOPHEN LEVEL  ETHANOL  PATHOLOGIST SMEAR REVIEW  I-STAT BETA HCG BLOOD, ED ([facility], [facility], [facility] ONLY)  TROPONIN I (HIGH SENSITIVITY)    EKG None  Radiology No results found.  Procedures .Critical Care  Performed by: Loetta Rough, MD Authorized by: Loetta Rough, MD   Critical care provider statement:    Critical care time (minutes):  80   Critical care was necessary to treat or prevent imminent or life-threatening deterioration of the following conditions:  Shock, respiratory failure and CNS failure or compromise   Critical care was time spent personally by me on the following activities:  Development of treatment plan with patient or surrogate, discussions with consultants, evaluation of patient's response to treatment, examination of patient, ordering and review of laboratory studies, ordering and review of radiographic studies, ordering and performing treatments and interventions, pulse oximetry, re-evaluation of patient's condition and review of old charts   {Document cardiac monitor, telemetry assessment procedure when appropriate:1}  Medications Ordered in ED Medications  0.9 %  sodium chloride infusion (has no administration in time range)  PHENYLephrine 80 mcg/ml in normal saline Adult IV Push Syringe (For Blood Pressure Support) (160 mcg Intravenous Given 01/26/2023 1750)  EPINEPHrine (ADRENALIN) 5 mg in NS 250 mL (0.02 mg/mL) premix infusion (30 mcg/min Intravenous Rate/Dose Change 02/07/2023 1752)  norepinephrine (LEVOPHED) 4mg  in (0.016 mg/mL) premix infusion (40 mcg/min Intravenous Rate/Dose Change 01/18/2023  1817)  sodium bicarbonate injection (50 mEq Intravenous Given 01/23/2023 1759)  dextrose 50 % solution (1 ampule Intravenous Given 01/26/2023 1800)  calcium chloride injection 1 g (has no administration in time range)  sodium bicarbonate 150 mEq in dextrose 5 % 1,150 mL infusion (has no administration in time range)  sodium bicarbonate injection 50 mEq (has no administration in time range)  fentaNYL in NS (77mcg/ml) infusion-PREMIX (has no administration in time range)  fentaNYL (SUBLIMAZE) bolus via infusion 25-100 mcg (has no administration in time range)  iohexol (OMNIPAQUE) 350 MG/ML injection 75 mL (has no administration in time range)  Ampicillin-Sulbactam (UNASYN) 3 g in sodium chloride 0.9 % 100 mL IVPB (has no administration in time range)  calcium chloride injection (1 g Intravenous Given 01/14/2023 1825)  ED Course/ Medical Decision Making/ A&P                          Medical Decision Making Amount and/or Complexity of Data Reviewed Labs: ordered. Decision-making details documented in ED Course. Radiology: ordered.  Risk Prescription drug management. Decision regarding hospitalization.    This patient presents to the ED for concern of ***, this involves an extensive number of treatment options, and is a complaint that carries with it a high risk of complications and morbidity.  I considered the following differential and admission for this acute, potentially life threatening condition.   MDM:    For causes of cardiac arrest, consider Hs/Ts:  Hypothermia/hyperthermia - *** patient normothermic Hypovolemia - *** Hypoxia -  *** Hypo- or hyperkalemia - *** No evidence of hyperK on EMS's EKGs, but will evaluate H+ (acidosis) - *** Tamponade - *** no h/o CP or SOB, but will evaluate with CXR Tension PTX - *** CXR Thrombosis (ACS, PE) - *** No evidence of STEMI on EMS's EKGs, no h/o CP or SOB Toxins - Will get UDS and serum tox  Patient known substance user,  likely with overdose today resulting in altered mental status, unresponsiveness, bradypnea/apnea, aspiration, hypoxia and respiratory arrest leading to cardiac arrest.  Also with severe acidosis contributing to arrest.  Patient's EKG demonstrates wide-complex normal sinus, initially was paged out by EMS as a STEMI.  Further information gleaned patient was in PEA arrest for 12 minutes in the clinical context and cardiology at bedside on patient's arrival states that they will not take her to the Cath Lab. Difficult to obtain views of bedside ultrasound of her heart however subxiphoid view demonstrates no pericardial effusion or tamponade.  Patient with bilateral 9 mm pupils that are nonreactive to light.  She has no gag reflex with deep laryngeal suctioning.  Great concern for severe neurologic devastation.  ***Found to be hypoglycemic with glucose of 23 given D50 and repeat glucose 192.  Patient with severe acidosis is given several pushes of bicarb as well as started on bicarb drip.  She has saturations in the 80s percent with tube, bags up to 85%.  PEEP is increased to 14, likely with aspiration will require more PEEP.  Also patient found to be biting on the tube, bite-block inserted with improvement in her O2 sat.    Clinical Course as of 01/15/2023 2225  Mon Jan 19, 2023  1834 NP Brett Canales with critical care at bedside.  Several members of patient's family including her sisters, daughter, and others were updated in the family consult room about her critical illness and possible neurologic devastation. [HN]  [phone] Patient on norepinephrine drip and epinephrine drip, saturations and BP now stable enough to go to CT scanner. [HN]  1858 Lactic Acid, Venous(!!): >9.0 [HN]  1858 pH, Arterial(!!): 6.654 After 2 pushes of bicarb [HN]  1858 pCO2 arterial(!!): 99.8 [HN]  1858 Glucose(!!): 23 Treated w/ D50 [HN]  1858 Glucose-Capillary(!): 192 [HN]  1858 Creatinine(!): 7.30 [HN]  1858 BUN(!): 61 [HN]  1858  Troponin I (High Sensitivity)(!!): 17,849 Post-ROSC, ICU aware [HN]  1858 Hemoglobin(!): 11.5 [HN]    Clinical Course User Index [HN] Loetta Rough, MD    Labs: I Ordered, and personally interpreted labs.  The pertinent results include:  ***  Imaging Studies ordered: I ordered imaging studies including *** I independently visualized and interpreted imaging. I agree with the radiologist interpretation  Additional history obtained from ***.  External records from outside source obtained and reviewed including ***  Cardiac Monitoring: The patient was maintained on a cardiac monitor.  I personally viewed and interpreted the cardiac monitored which showed an underlying rhythm of: ***  Reevaluation: After the interventions noted above, I reevaluated the patient and found that they have :{resolved/improved/worsened:23923::"improved"}  Social Determinants of Health: ***  Disposition:  ***  Co morbidities that complicate the patient evaluation  Past Medical History:  Diagnosis Date   Anxiety    Depression    PTSD (post-traumatic stress disorder)      Medicines Meds ordered this encounter  Medications   0.9 %  sodium chloride infusion   DISCONTD: aspirin chewable tablet 324 mg   DISCONTD: heparin injection 60 Units/kg   PHENYLephrine 80 mcg/ml in normal saline Adult IV Push Syringe (For Blood Pressure Support)   EPINEPHrine (ADRENALIN) 5 mg in NS 250 mL (0.02 mg/mL) premix infusion   norepinephrine (LEVOPHED) 4mg  in (0.016 mg/mL) premix infusion   sodium bicarbonate injection   dextrose 50 % solution   calcium chloride injection 1 g   sodium bicarbonate 150 mEq in dextrose 5 % 1,150 mL infusion    Sodium Bicarbonate 150 mEq added to 1L D5W = 480 mOsm/L (effective osmolarity: 261 mOsm/L).   sodium bicarbonate injection 50 mEq   fentaNYL in NS (60mcg/ml) infusion-PREMIX   fentaNYL (SUBLIMAZE) bolus via infusion 25-100 mcg   iohexol (OMNIPAQUE) 350  MG/ML injection 75 mL   Ampicillin-Sulbactam (UNASYN) 3 g in sodium chloride 0.9 % 100 mL IVPB    Order Specific Question:   Antibiotic Indication:    Answer:   Aspiration Pneumonia   calcium chloride injection    I have reviewed the patients home medicines and have made adjustments as needed  Problem List / ED Course: Problem List Items Addressed This Visit   None        {Document critical care time when appropriate:1} {Document review of labs and clinical decision tools ie heart score, Chads2Vasc2 etc:1}  {Document your independent review of radiology images, and any outside records:1} {Document your discussion with family members, caretakers, and with consultants:1} {Document social determinants of health affecting pt's care:1} {Document your decision making why or why not admission, treatments were needed:1}  This note was created using dictation software, which may contain spelling or grammatical errors.

## 2023-01-19 NOTE — Progress Notes (Signed)
Pt transported to CT and back to Trauma B on the vent w/o any complications.

## 2023-01-19 NOTE — ED Notes (Signed)
Christy Walker [phone] would like a call back from patient and an update
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
## 2023-01-19 NOTE — Progress Notes (Signed)
Chaplain escorted family members to 2H family waiting area.  Chaplain alerted pt's care team to their presence in waiting area.    Chaplain will continue to check in with the family. Please alert Chaplain at [phone] should pt's status change.  Vernell Morgans Chaplain

## 2023-01-19 NOTE — Progress Notes (Signed)
Chaplain checked back in on pt. Chaplain requested to be on standby for physician family consult.  Chaplain accompanied physician for update with family.  Chaplain attended to family offering quiet presence and listening ear when needed.  Vernell Morgans Chaplain

## 2023-01-19 NOTE — Progress Notes (Signed)
RT and RN transported vent patient from ER to 2H, vital signs stable through out.

## 2023-01-19 NOTE — Progress Notes (Signed)
Reached out to Rns again. MD to discuss comfort care options. RN will notify Tech is EEG can be done or cancelled

## 2023-01-19 NOTE — Progress Notes (Signed)
Consulted by pharmacy for ultrasound guided PIV for pressor, upon arrival made aware central line currently in place.Marland Kitchen

## 2023-01-19 NOTE — Progress Notes (Signed)
Pharmacy Antibiotic Note  Christy Walker is a 52 y.o. female admitted on 01/18/2023 with aspiration pneumonia.  Pharmacy has been consulted for Unasyn dosing. sCr 7.3, WBC 12.8, lactate >9 on 3 pressors.  Plan: Unasyn 3g IV every 24 hours (CrCl 10.2 ml/min) Monitor renal function for improvement in sCr and dose adjustment  Follow for signs of clinical improvement and de-escalation of antibiotics  Height: 5\' 8"  (172.7 cm) Weight: 81 kg (178 lb 9.2 oz) IBW/kg (Calculated) : 63.9  Temp (24hrs), Avg:97.8 F (36.6 C), Min:97.8 F (36.6 C), Max:97.8 F (36.6 C)  Recent Labs  Lab 02/06/2023 1746 01/23/2023 1748 01/11/2023 1749  WBC 12.8*  --   --   CREATININE 7.38* 7.30*  --   LATICACIDVEN  --   --  >9.0*    Estimated Creatinine Clearance: 10.2 mL/min (A) (by C-G formula based on SCr of 7.3 mg/dL (H)).    No Known Allergies  Antimicrobials this admission: Unasyn 6/10 >>   Microbiology results: 6/10 MRSA PCR:   Thank you for allowing pharmacy to be a part of this patient's care.  Arabella Merles, PharmD. [facility] Acute Care PGY-1  01/18/2023 9:12 PM

## 2023-01-19 NOTE — Progress Notes (Signed)
RN wanted [place] to hold off until patient relatives had a chance to be at bedside.

## 2023-01-19 NOTE — ED Triage Notes (Signed)
Pt bib [facility] after being found down by friends with agonal respirations. EMS reports ? Drug use. Last seen normal 0945 today. On scene pt found to be in PEA arrest, received 12 minutes of CPR, 2 epi before rosc. Started on Epi gtt 10 mcg for BP. Pt  intubated PTA. GCS 3.

## 2023-01-19 NOTE — ED Notes (Signed)
Pt with bilateral breath sounds, pupils blown and nonreactive bilaterally.

## 2023-01-19 NOTE — ED Notes (Signed)
Nena Polio Crib [phone] would like an update asap

## 2023-01-19 NOTE — Progress Notes (Signed)
Chaplain responded to Code Stemi.  Upon pt arrival Code Stemi cancelled.    Chaplain available if need.  Vernell Morgans Chaplain

## 2023-01-19 NOTE — Progress Notes (Addendum)
eLink Physician-Brief Progress Note Patient Name: Christy Walker DOB: [birth year] MRN: [id number]   Date of Service  01/13/2023  HPI/Events of Note  52 year old female with a history of major depressive disorder, anxiety disorder PTSD who presented via EMS in PEA arrest with limited neurological response with hypoxemia requiring high degrees of support.  Multiorgan dysfunction.  Patient was tachycardic, hypotensive, and hypoxic brain she was started on epinephrine, norepinephrine and vasopressin with persistent hypotension.  Laboratory studies show hyperkalemia, severe acidosis, elevated creatinine, and elevated LFT T's.  Troponin is greater than 17,000.  Lactic acid is above detectable levels.  She has no leukocytosis.  INR is elevated.  CT of the abdomen/pelvis consistent with acute small bowel obstruction  eICU Interventions  The patient is currently maximized on 3 vasopressors and remains severely acidotic.  In the setting of her acute small bowel obstruction, ideally the patient would undergo surgical intervention but in her hemodynamic state her mortality would be exceptionally high.  With her current multiorgan dysfunction, mortality is exceptionally high regardless.   Will attempt to optimize for family to be at bedside    2120 -spoke with 2 daughters at bedside and answered any possible questions.  Discussed the gravity of the situation and multiorgan dysfunction.  Currently DNR, maintain DNR. 2205 - Add arterial line   2254 -maxed on 3 pressors to additional intervention/pressors.  Will bring family back and discuss comfort measures.  2316 -patient's daughter is at bedside, she feels overwhelmed with the amount of decisions she has to make, but recognizes that [name] is receiving maximal life support and unlikely to recover.  She is okay with proceeding with comfort measures and recognizes what the process would involve.  She has friends that are coming in to support her.  [name] has no  additional family members that would be present.  Offered chaplain support.  We discussed comfort measures with staff at bedside-will update orders to reflect comfort measures only once the family is ready to proceed.  Intervention Category Evaluation Type: New Patient Evaluation  Jeoffrey Eleazer 01/17/2023, 8:34 PM

## 2023-01-19 NOTE — ED Notes (Signed)
Pt desat to the 70s, BVM initiated

## 2023-01-19 NOTE — Progress Notes (Signed)
RT attempted arterial line X2 and was unsuccessful due to faint pulse.  Pulse could not be detected with doppler.  RN aware and  she will notify the ground team.

## 2023-01-19 NOTE — ED Notes (Signed)
Critical care MD explained patients status/plan of care  to family at bedside , chaplain with family for support .

## 2023-01-20 ENCOUNTER — Other Ambulatory Visit (HOSPITAL_COMMUNITY): Payer: 59

## 2023-01-20 LAB — URINE CULTURE: Culture: NO GROWTH

## 2023-01-21 LAB — PATHOLOGIST SMEAR REVIEW

## 2023-02-09 NOTE — Progress Notes (Signed)
Patient arrived at 2020 over max limits of multiple vasopressors. BP cuffs were variable. E-link notified. RT unable to place arterial line. Family at bedside. BP trending down so E-link notified again. E-link spoke to family and patient was transitioned to comfort care.   Patient passed at 0055. Two RN's pronounced, E-link aware.

## 2023-02-09 NOTE — Progress Notes (Signed)
Pt extubated to comfort care per order. 

## 2023-02-09 NOTE — Death Summary Note (Signed)
DEATH SUMMARY   Patient Details  Name: Christy Walker MRN: [id number] DOB: [birth year]  Admission/Discharge Information   Admit Date:  01/27/2023  Date of Death: Date of Death: 01-28-2023  Time of Death: Time of Death: 02/10/2054  Length of Stay: 1  Referring Physician: Pcp, No   Reason(s) for Hospitalization  Septic shock due to aspiration pneumonia Acute hypoxic/hypercapnic respiratory failure Severe Mixed metabolic/respiratory acidosis S/p PEA cardiac arrest Severe hypoglycemia Hyperkalemia/hypocalcemia Acute encephalopathy, multifactorial including metabolic/anoxic Probable polysubstance abuse DNR status  Diagnoses  Preliminary cause of death: Multisystem organ failure in the setting of severe acidosis s/p PEA cardiac arrest Secondary Diagnoses (including complications and co-morbidities):  Principal Problem:   Cardiac arrest [facility])   Brief Hospital Course (including significant findings, care, treatment, and services provided and events leading to death)  Christy Walker is a 52 y.o. year old female who was found unresponsive with agonal respiratory pattern at a known drug house.  Last seen normal around 09 40 5 in the morning on 2023-01-27 on EMS arrival patient found in PEA required 12 minutes of ACLS including 2 rounds of epinephrine before return of spontaneous circulation she was intubated in the field, supported on epinephrine infusion, required norepinephrine additionally to obtain mean arterial pressure greater than 65.  GCS was 3 on arrival, initial chest x-ray concerning for fairly significant right-sided aspiration.  She was hypoxic in the emergency room requiring escalation of PEEP up to 14.  Initial blood gas post ventilator settings show a pH of 6.65 pCO2 of 100 pO2 of 118 and bicarbonate of 11 pulse oximetry 88%.  Initial glucose noted to be less than 10 potassium 5.6 creatinine 7.3 BUN 61 she had a wide QRS complex.  Was initially patient is NSTEMI however this was ruled out  given electrolyte abnormality.  Critical care asked to admit.   Patient was admitted to ICU, she remains severely acidotic despite multiple bicarbonate IV pushes with bicarbonate infusion, vent setting was adjusted to clear hypercapnia.  She was hypotensive requiring multiple vasopressors, despite aggressive care patient was in multisystem organ failure, she continued to decline.  Goals of care discussions were carried with family, the Patient DNR considering minimal chances of meaningful recovery, patient passed on 2023/01/28 at 12:55 AM, patient's family was at bedside  Pertinent Labs and Studies  Significant Diagnostic Studies CT HEAD WO CONTRAST ( )  Result Date: 01-27-2023 CLINICAL DATA:  Head trauma, abnormal mental status EXAM: CT HEAD WITHOUT CONTRAST CT CERVICAL SPINE WITHOUT CONTRAST TECHNIQUE: Multidetector CT imaging of the head and cervical spine was performed following the standard protocol without intravenous contrast. Multiplanar CT image reconstructions of the cervical spine were also generated. RADIATION DOSE REDUCTION: This exam was performed according to the departmental dose-optimization program which includes automated exposure control, adjustment of the mA and/or kV according to patient size and/or use of iterative reconstruction technique. COMPARISON:  CT cervical spine 09/25/2013 and CT head 12/12/2011 FINDINGS: CT HEAD FINDINGS Brain: No intracranial hemorrhage, mass effect, or evidence of acute infarct. No hydrocephalus. No extra-axial fluid collection. Vascular: No hyperdense vessel. Intracranial arterial calcification. Skull: No fracture or focal lesion. Sinuses/Orbits: Air-fluid levels in the right greater than left maxillary sinuses. Mild mucosal thickening in the ethmoid air cells. Small amount of fluid in the sphenoid sinuses. No mastoid effusion. Other: Partially visualized endotracheal and enteric tubes. CT CERVICAL SPINE FINDINGS Alignment: No evidence of traumatic  malalignment. Skull base and vertebrae: No acute fracture. No primary bone lesion or focal pathologic process. Soft  tissues and spinal canal: No prevertebral fluid or swelling. No visible canal hematoma. Disc levels: Incidental note of bilateral C7 cervical ribs. Multilevel spondylosis, disc space height loss, degenerative endplate change greatest at C3-C4 where it is moderate to advanced. No severe spinal canal narrowing. Upper chest: See separate report for findings in the chest. Other: Endotracheal and enteric tubes. IMPRESSION: 1. No acute intracranial abnormality. 2. No acute fracture in the cervical spine. Electronically Signed   By: Minerva Fester M.D.   On: January 28, 2023 19:40   CT Cervical Spine Wo Contrast  Result Date: 01-28-23 CLINICAL DATA:  Head trauma, abnormal mental status EXAM: CT HEAD WITHOUT CONTRAST CT CERVICAL SPINE WITHOUT CONTRAST TECHNIQUE: Multidetector CT imaging of the head and cervical spine was performed following the standard protocol without intravenous contrast. Multiplanar CT image reconstructions of the cervical spine were also generated. RADIATION DOSE REDUCTION: This exam was performed according to the departmental dose-optimization program which includes automated exposure control, adjustment of the mA and/or kV according to patient size and/or use of iterative reconstruction technique. COMPARISON:  CT cervical spine 09/25/2013 and CT head 12/12/2011 FINDINGS: CT HEAD FINDINGS Brain: No intracranial hemorrhage, mass effect, or evidence of acute infarct. No hydrocephalus. No extra-axial fluid collection. Vascular: No hyperdense vessel. Intracranial arterial calcification. Skull: No fracture or focal lesion. Sinuses/Orbits: Air-fluid levels in the right greater than left maxillary sinuses. Mild mucosal thickening in the ethmoid air cells. Small amount of fluid in the sphenoid sinuses. No mastoid effusion. Other: Partially visualized endotracheal and enteric tubes. CT CERVICAL  SPINE FINDINGS Alignment: No evidence of traumatic malalignment. Skull base and vertebrae: No acute fracture. No primary bone lesion or focal pathologic process. Soft tissues and spinal canal: No prevertebral fluid or swelling. No visible canal hematoma. Disc levels: Incidental note of bilateral C7 cervical ribs. Multilevel spondylosis, disc space height loss, degenerative endplate change greatest at C3-C4 where it is moderate to advanced. No severe spinal canal narrowing. Upper chest: See separate report for findings in the chest. Other: Endotracheal and enteric tubes. IMPRESSION: 1. No acute intracranial abnormality. 2. No acute fracture in the cervical spine. Electronically Signed   By: Minerva Fester M.D.   On: 2023-01-28 19:40   CT CHEST ABDOMEN PELVIS W CONTRAST  Result Date: 01/28/2023 CLINICAL DATA:  Found down EXAM: CT CHEST, ABDOMEN, AND PELVIS WITH CONTRAST TECHNIQUE: Multidetector CT imaging of the chest, abdomen and pelvis was performed following the standard protocol during bolus administration of intravenous contrast. RADIATION DOSE REDUCTION: This exam was performed according to the departmental dose-optimization program which includes automated exposure control, adjustment of the mA and/or kV according to patient size and/or use of iterative reconstruction technique. CONTRAST:  75mL OMNIPAQUE IOHEXOL 350 MG/ML SOLN COMPARISON:  CT abdomen 09/09/2007 FINDINGS: CT CHEST FINDINGS Cardiovascular: Normal heart size. No pericardial effusion. Mild coronary artery atherosclerotic calcification. No acute aortic syndrome or saddle pulmonary embolism. Of gas scattered locules within the thoracic veins likely due to contrast injection. Mediastinum/Nodes: Anterior pneumomediastinum. Endotracheal tube tip in the intrathoracic trachea. Enteric tube tip in the stomach. No thoracic adenopathy. Lungs/Pleura: Patchy bilateral ground-glass opacities greatest in the right upper lobe. There are more confluence  peribronchovascular consolidative opacities in the bilateral lower lobes. Diffuse interlobular septal thickening. Small bilateral pleural effusions. No pneumothorax. Musculoskeletal: Bilateral cervical ribs. Nondisplaced anterior right 4th-6th rib fractures. Buckle fracture of the anterior cortex of the mid sternal body. CT ABDOMEN PELVIS FINDINGS Hepatobiliary: Marked hepatic steatosis. Distended gallbladder. No biliary dilation. Pancreas: Unremarkable. Spleen: Unremarkable. Adrenals/Urinary  Tract: Bilateral adrenal hyperenhancement is favored related to contrast timing but can be seen with hypoperfusion. Geographic areas of hypoattenuation in the right kidney suspicious for pyelonephritis the infarct. The left kidney is partially obscured by streak artifact from adjacent spinal fusion hardware. No urinary calculi hydronephrosis unremarkable bladder. Stomach/Bowel: Mild distention of the stomach. Multiple loops of dilated jejunum with air-fluid levels in the left upper quadrant with transition point on series 4/image 93. The bowel downstream from this point is normal in caliber. Normal caliber colon. No bowel wall thickening. Venous no pneumatosis or portal venous gas. Tentative visualization of normal appendix. Vascular/Lymphatic: No acute vascular abnormality. Mild aortic atherosclerosis. Portal vein is patent. Reproductive: Uterus and bilateral adnexa are unremarkable. Other: No free intraperitoneal fluid or air. Musculoskeletal: Fusion T12-L2. Superior endplate compression deformity of L3 is presumed chronic. No acute fracture. IMPRESSION: CHEST 1. Patchy bilateral ground-glass opacities greatest in the right upper lobe. More confluent peribronchovascular consolidative opacities in the bilateral lower lobes. Findings are nonspecific and may be due to edema or multifocal pneumonia. Small bilateral pleural effusions 2. Small volume anterior pneumomediastinum. No pneumothorax. 3. Nondisplaced anterior right  4th-6th rib fractures. 4. Buckle fracture of the anterior cortex of the mid sternal body. ABDOMEN/PELVIS 1. Multiple loops of dilated jejunum with air-fluid levels in the left upper quadrant with abrupt transition point compatible small-bowel obstruction. 2. Geographic areas of hypoattenuation in the right kidney suspicious for pyelonephritis or infarcts. 3. Marked hepatic steatosis. 4. Bilateral adrenal hyperenhancement is favored related to contrast timing but could also be due to hypoperfusion. Electronically Signed   By: Minerva Fester M.D.   On: 02-16-23 19:28   DG Chest Portable 1 View  Result Date: 02/16/23 CLINICAL DATA:  ETT placement EXAM: PORTABLE CHEST 1 VIEW COMPARISON:  Chest x-ray dated November 28, 2007 FINDINGS: ETT tip is 2.5 cm from the carina. Enteric tube is partially seen coursing below the diaphragm. Right-greater-than-left heterogeneous opacities. No evidence of pleural effusion or pneumothorax. Trace pneumomediastinum. IMPRESSION: 1. ETT tip is 2.5 cm from the carina. 2. Right-greater-than-left heterogeneous opacities, differential considerations include aspiration or infection. 3. Trace pneumomediastinum. Electronically Signed   By: Allegra Lai M.D.   On: February 16, 2023 19:15    Microbiology Recent Results (from the past 240 hour(s))  MRSA Next Gen by PCR, Nasal     Status: Abnormal   Collection Time: 2023-02-16  8:19 PM   Specimen: Nasal Mucosa; Nasal Swab  Result Value Ref Range Status   MRSA by PCR Next Gen DETECTED (A) NOT DETECTED Final    Comment: RESULT CALLED TO, READ BACK BY AND VERIFIED WITH: K CROSS,RN@[email or web address]  02-16-2023 MK (NOTE) The GeneXpert MRSA Assay (FDA approved for NASAL specimens only), is one component of a comprehensive MRSA colonization surveillance program. It is not intended to diagnose MRSA infection nor to guide or monitor treatment for MRSA infections. Test performance is not FDA approved in patients less than 12 years old. Performed at [facility], [address] [place], [place] [address]     Lab Basic Metabolic Panel: Recent Labs  Lab Feb 16, 2023 1746 Feb 16, 2023 1748 2023-02-16 1822 02-16-2023 2110  NA 150* 144  143 142 140  K 5.9* 5.6*  5.6* 6.6* 5.5*  CL 103 115*  --  96*  CO2 <7*  --   --  14*  GLUCOSE 27* 23*  --  271*  BUN 48* 61*  --  47*  CREATININE 7.38* 7.30*  --  7.59*  CALCIUM 8.0*  --   --  9.6   Liver Function Tests: Recent Labs  Lab 01/18/2023 1746  AST 2,688*  ALT 1,543*  ALKPHOS 81  BILITOT 0.6  PROT 5.0*  ALBUMIN 2.6*   No results for input(s): "LIPASE", "AMYLASE" in the last 168 hours. No results for input(s): "AMMONIA" in the last 168 hours. CBC: Recent Labs  Lab 01/22/2023 1746 01/10/2023 1748 01/17/2023 1822 01/21/2023 2110  WBC 12.8*  --   --  8.9  NEUTROABS 2.0  --   --   --   HGB 11.5* 13.3  12.6 10.5* 10.8*  HCT 42.2 39.0  37.0 31.0* 39.0  MCV 90.0  --   --  85.2  PLT 192  --   --  168   Cardiac Enzymes: No results for input(s): "CKTOTAL", "CKMB", "CKMBINDEX", "TROPONINI" in the last 168 hours. Sepsis Labs: Recent Labs  Lab 01/13/2023 1746 02/02/2023 1749 01/18/2023 2110  WBC 12.8*  --  8.9  LATICACIDVEN  --  >9.0* >9.0*    Procedures/Operations     Victor Langenbach 01/15/2023, 12:31 PM

## 2023-02-09 DEATH — deceased
